# Patient Record
Sex: Female | Born: 1955 | Race: White | Hispanic: No | Marital: Married | Smoking: Never smoker
Health system: Southern US, Community
[De-identification: ages and names within clinical notes are randomized; demographics above are authoritative.]

## PROBLEM LIST (undated history)

## (undated) DIAGNOSIS — M1990 Unspecified osteoarthritis, unspecified site: Secondary | ICD-10-CM

## (undated) DIAGNOSIS — I10 Essential (primary) hypertension: Secondary | ICD-10-CM

## (undated) DIAGNOSIS — R011 Cardiac murmur, unspecified: Secondary | ICD-10-CM

## (undated) DIAGNOSIS — E785 Hyperlipidemia, unspecified: Secondary | ICD-10-CM

## (undated) DIAGNOSIS — E119 Type 2 diabetes mellitus without complications: Secondary | ICD-10-CM

## (undated) DIAGNOSIS — G43909 Migraine, unspecified, not intractable, without status migrainosus: Secondary | ICD-10-CM

## (undated) HISTORY — DX: Migraine, unspecified, not intractable, without status migrainosus: G43.909

## (undated) HISTORY — DX: Cardiac murmur, unspecified: R01.1

## (undated) HISTORY — DX: Unspecified osteoarthritis, unspecified site: M19.90

## (undated) HISTORY — PX: COLONOSCOPY: SHX174

## (undated) HISTORY — DX: Type 2 diabetes mellitus without complications: E11.9

## (undated) HISTORY — DX: Essential (primary) hypertension: I10

## (undated) HISTORY — DX: Hyperlipidemia, unspecified: E78.5

---

## 2003-02-21 HISTORY — PX: APPENDECTOMY: SHX54

## 2014-02-20 HISTORY — PX: MECHANICAL AORTIC VALVE REPLACEMENT: SHX2013

## 2017-12-06 ENCOUNTER — Ambulatory Visit (INDEPENDENT_AMBULATORY_CARE_PROVIDER_SITE_OTHER): Payer: BLUE CROSS/BLUE SHIELD | Admitting: Family Medicine

## 2017-12-06 ENCOUNTER — Encounter: Payer: Self-pay | Admitting: Family Medicine

## 2017-12-06 VITALS — BP 150/70 | HR 90 | Temp 98.5°F | Ht 64.0 in | Wt 249.2 lb

## 2017-12-06 DIAGNOSIS — Z952 Presence of prosthetic heart valve: Secondary | ICD-10-CM | POA: Insufficient documentation

## 2017-12-06 DIAGNOSIS — G43809 Other migraine, not intractable, without status migrainosus: Secondary | ICD-10-CM

## 2017-12-06 DIAGNOSIS — E785 Hyperlipidemia, unspecified: Secondary | ICD-10-CM

## 2017-12-06 DIAGNOSIS — E1169 Type 2 diabetes mellitus with other specified complication: Secondary | ICD-10-CM

## 2017-12-06 DIAGNOSIS — G43909 Migraine, unspecified, not intractable, without status migrainosus: Secondary | ICD-10-CM | POA: Insufficient documentation

## 2017-12-06 DIAGNOSIS — I152 Hypertension secondary to endocrine disorders: Secondary | ICD-10-CM

## 2017-12-06 DIAGNOSIS — Z1211 Encounter for screening for malignant neoplasm of colon: Secondary | ICD-10-CM

## 2017-12-06 DIAGNOSIS — E119 Type 2 diabetes mellitus without complications: Secondary | ICD-10-CM

## 2017-12-06 DIAGNOSIS — I10 Essential (primary) hypertension: Secondary | ICD-10-CM

## 2017-12-06 DIAGNOSIS — Z23 Encounter for immunization: Secondary | ICD-10-CM

## 2017-12-06 DIAGNOSIS — E1159 Type 2 diabetes mellitus with other circulatory complications: Secondary | ICD-10-CM

## 2017-12-06 MED ORDER — METOPROLOL SUCCINATE ER 25 MG PO TB24: 25.0000 mg | ORAL_TABLET | Freq: Every day | ORAL | 3 refills | Status: DC

## 2017-12-06 NOTE — Progress Notes (Signed)
Denise Curtis DOB: 08/13/1955 Encounter date: 12/06/2017  This is a 62 y.o. female who presents to establish care. Chief Complaint  Patient presents with  . New Patient (Initial Visit)    flu shot?    History of present illness: Just moved here from [place]. Had planned to retire here in a few years but decided to move a little early for husband's job opportunity.   DM: Dx in 2010. Sugars go up and down "just like my weight". Not in best shape, not been exercising since she moved. Eating out more. Has actually had every meal at home this week. Had diarrhea and nausea with metformin.   Mechanical heart valve 2.5-3.5 range. Monitoring at home and has app on phone where she taps in values and then coumadin clinic notified and they contact her. She needs to get set up with local monitoring. Has had a hard time keeping it in normal range in last year but has had some weight and stress fluctuation.   Past Medical History:  Diagnosis Date  . Diabetes mellitus type II, non insulin dependent ([place])   . Hyperlipidemia   . Hypertension   . Migraines    Past Surgical History:  Procedure Laterality Date  . APPENDECTOMY  2005  . MECHANICAL AORTIC VALVE REPLACEMENT  2016   Allergies  Allergen Reactions  . Metformin And Related Nausea And Vomiting  . Hydrochlorothiazide Anxiety  . Penicillins Rash   Current Meds  Medication Sig  . atorvastatin (LIPITOR) 40 MG tablet Take 40 mg by mouth daily.  . clindamycin (CLEOCIN) 300 MG capsule Take 300 mg by mouth 2 (two) times daily. Prior to dental procedure  . empagliflozin (JARDIANCE) 25 MG TABS tablet Take 25 mg by mouth daily.  Marland Kitchen lisinopril (PRINIVIL,ZESTRIL) 5 MG tablet Take 5 mg by mouth daily.  Marland Kitchen warfarin (COUMADIN) 5 MG tablet Take 5 mg by mouth daily. 1 1/2 tablet daily except thursday, take 2 tablets   Social History   Tobacco Use  . Smoking status: Never Smoker  . Smokeless tobacco: Never Used  Substance Use Topics  . Alcohol  use: Yes    Comment: occasional   Family History  Problem Relation Age of Onset  . COPD Mother   . Heart disease Mother   . Squamous cell carcinoma Mother 69  . COPD Father   . Stroke Paternal Grandfather      Review of Systems  Objective:  BP (!) 150/70 (BP Location: Left Arm, Patient Position: Sitting, Cuff Size: Large)   Pulse 90   Temp 98.5 F (36.9 C) (Oral)   Ht 5\' 4"  (1.626 m)   Wt 249 lb 3.2 oz (113 kg)   SpO2 95%   BMI 42.78 kg/m   Weight: 249 lb 3.2 oz (113 kg)   BP Readings from Last 3 Encounters:  12/06/17 (!) 150/70   Wt Readings from Last 3 Encounters:  12/06/17 249 lb 3.2 oz (113 kg)    Physical Exam  Constitutional: She is oriented to person, place, and time. She appears well-developed. No distress.  obese  Cardiovascular: Normal rate and regular rhythm. Exam reveals no friction rub.  Murmur heard.  Systolic murmur is present with a grade of 2/6. Trace lower extremity edema Crisp mechanical heart sound  Pulmonary/Chest: Effort normal and breath sounds normal. No respiratory distress. She has no wheezes. She has no rales.  Feet:  Right Foot:  Protective Sensation: 10 sites tested. 10 sites sensed.  Skin Integrity: Negative for ulcer,  blister, skin breakdown, erythema or callus.  Left Foot:  Protective Sensation: 10 sites tested. 10 sites sensed.  Skin Integrity: Negative for ulcer, blister, skin breakdown, erythema or callus.  Neurological: She is alert and oriented to person, place, and time.  Psychiatric: Her behavior is normal. Cognition and memory are normal.    Assessment/Plan: 1. Diabetes mellitus type II, non insulin dependent ([place]) Will repeat bloodwork in future; follow up pending results. Discussed importance of regular exercise and she is trying to eat more meals at home. She is motivated to get to healthier weight and stay in control of medical issues. - Hemoglobin A1c; Future - Microalbumin / creatinine urine ratio; Future - HM  DIABETES FOOT EXAM  2. Other migraine without status migrainosus, not intractable Improved since heart valve placement. Occasional headache, tolerable.  3. Mechanical heart valve present Follows with cardiology. On warfarin. Monitoring at home. We will look into process for staying with this remote testing since so convenient for her and she has had some difficulty with control. .  4. Screening for colon cancer Has put off colonoscopy due to task of weaning off coumadin and bridging back on. Willing to start with cologuard. - Cologuard  5. Hyperlipidemia associated with type 2 diabetes mellitus ([place])  - Lipid panel; Future  6. Hypertension associated with diabetes ([place]) Slightly elevated. Continue current medication for now and we will reassess at future visit after bloodwork.  - metoprolol succinate (TOPROL-XL) 25 MG 24 hr tablet; Take 1 tablet (25 mg total) by mouth daily.  Dispense: 90 tablet; Refill: 3 - CBC with Differential/Platelet; Future - Comprehensive metabolic panel; Future  7. Need for immunization against influenza  - Flu Vaccine QUAD 36+ mos IM  Return pending bloodwork.   Willing to get other immunizations at follow up ([place])  Micheline Rough, MD

## 2017-12-07 ENCOUNTER — Encounter: Payer: Self-pay | Admitting: Family Medicine

## 2018-01-02 ENCOUNTER — Other Ambulatory Visit: Payer: Self-pay | Admitting: Family Medicine

## 2018-01-02 NOTE — Telephone Encounter (Signed)
Copied from [place] [id number]. Topic: Quick Communication - See Telephone Encounter >> Jan 02, 2018  1:08 PM Ivar Drape wrote: CRM for notification. See Telephone encounter for: 01/02/18. Patient would like a refill on her warfarin (COUMADIN) 5 MG tablet medication and have it [facility] [place].  Patient is dangerously low on the medication and she will be leaving town tomorrow.

## 2018-01-02 NOTE — Telephone Encounter (Signed)
Requested medication (s) are due for refill today: Unsure  Requested medication (s) are on the active medication list: Yes  Last refill:  Unsure  Future visit scheduled: No  Notes to clinic:  Coumadin refill    Requested Prescriptions  Pending Prescriptions Disp Refills   warfarin (COUMADIN) 5 MG tablet      Sig: Take 1 tablet (5 mg total) by mouth daily. 1 1/2 tablet daily except thursday, take 2 tablets     Hematology:  Anticoagulants - warfarin Failed - 01/02/2018  1:13 PM      Failed - If the patient is managed by Coumadin Clinic - route to their Pool. If not, forward to the provider.      Failed - INR in normal range and within 30 days    No results found for: INR, LABINR, INRTHROMBS       Passed - Valid encounter within last 3 months    Recent Outpatient Visits          3 weeks ago Diabetes mellitus type II, non insulin dependent ([place])   [place] [facility], Steele Berg, MD

## 2018-01-03 ENCOUNTER — Other Ambulatory Visit: Payer: Self-pay | Admitting: General Practice

## 2018-01-03 MED ORDER — WARFARIN SODIUM 5 MG PO TABS: ORAL_TABLET | ORAL | 0 refills | Status: DC

## 2018-01-15 ENCOUNTER — Other Ambulatory Visit: Payer: Self-pay | Admitting: Family Medicine

## 2018-01-15 NOTE — Telephone Encounter (Signed)
Copied from [place] [id number]. Topic: Quick Communication - Rx Refill/Question >> Jan 15, 2018  2:52 PM Conception Chancy, NT wrote: Medication: lisinopril (PRINIVIL,ZESTRIL) 5 MG tablet and empagliflozin (JARDIANCE) 25 MG TABS tablet (90 day supply for both)  Has the patient contacted their pharmacy? Yes.  Was told to contact her pcp since these will be new prescriptions with her new provider  Preferred Pharmacy (with phone number or street name): [id number] [address], [place] - [address] [place] [address] Phone: [phone] Fax: [phone]    Agent: Please be advised that RX refills may take up to 3 business days. We ask that you follow-up with your pharmacy.

## 2018-01-15 NOTE — Telephone Encounter (Signed)
Historical med.  Ok to fill? 

## 2018-01-16 MED ORDER — LISINOPRIL 5 MG PO TABS: 5.0000 mg | ORAL_TABLET | Freq: Every day | ORAL | 1 refills | Status: DC

## 2018-01-16 NOTE — Telephone Encounter (Signed)
ok 

## 2018-01-16 NOTE — Telephone Encounter (Signed)
RX has been sent.

## 2018-01-21 ENCOUNTER — Telehealth: Payer: Self-pay | Admitting: Family Medicine

## 2018-01-21 NOTE — Telephone Encounter (Signed)
Pt states the  empagliflozin (JARDIANCE) 25 MG TABS tablet Was never called in.  Dr Ethlyn Gallery has never filled this for her. Needs 90 day   [id number] [address], [place] - [place] [phone] (Phone) [phone] (Fax)

## 2018-01-21 NOTE — Telephone Encounter (Signed)
Copied from [place] [id number]. Topic: Quick Communication - Rx Refill/Question >> Jan 21, 2018  3:29 PM Denise Curtis wrote: Medication: warfarin (COUMADIN) 5 MG tablet  Pt needs to be set up for her coum check, pt coming in wed afternoon for that to be checked

## 2018-01-22 MED ORDER — EMPAGLIFLOZIN 25 MG PO TABS: 25.0000 mg | ORAL_TABLET | Freq: Every day | ORAL | 2 refills | Status: DC

## 2018-01-22 NOTE — Telephone Encounter (Signed)
RX sent

## 2018-01-22 NOTE — Addendum Note (Signed)
Addended by: Rebecca Eaton on: 01/22/2018 11:09 AM   Modules accepted: Orders

## 2018-01-23 ENCOUNTER — Other Ambulatory Visit: Payer: Self-pay | Admitting: General Practice

## 2018-01-23 ENCOUNTER — Ambulatory Visit (INDEPENDENT_AMBULATORY_CARE_PROVIDER_SITE_OTHER): Payer: BLUE CROSS/BLUE SHIELD | Admitting: General Practice

## 2018-01-23 DIAGNOSIS — Z952 Presence of prosthetic heart valve: Secondary | ICD-10-CM

## 2018-01-23 DIAGNOSIS — Z7901 Long term (current) use of anticoagulants: Secondary | ICD-10-CM | POA: Insufficient documentation

## 2018-01-23 LAB — POCT INR: INR: 3.6 — AB (ref 2.0–3.0)

## 2018-01-23 MED ORDER — WARFARIN SODIUM 5 MG PO TABS: ORAL_TABLET | ORAL | 0 refills | Status: DC

## 2018-01-23 NOTE — Patient Instructions (Signed)
Pre visit review using our clinic review tool, if applicable. No additional management support is needed unless otherwise documented below in the visit note.  Hold dosage today (12/4) and then take 7.5 mg daily except 10 mg on Wednesdays.  Re-check in 1 week.  Patient will be using [employer] home monitoring service.

## 2018-01-24 ENCOUNTER — Telehealth: Payer: Self-pay

## 2018-01-24 NOTE — Telephone Encounter (Signed)
Copied from [place] [id number]. Topic: Appointment Scheduling - Transfer of Care >> Jan 24, 2018  4:14 PM Judyann Munson wrote: Pt is requesting to transfer FROM: Denise Curtis  Pt is requesting to transfer TO: [name]  Reason for requested transfer:  Husband is a patient of Dr.Kremer   Send CRM to patient's current PCP (transferring FROM).

## 2018-01-24 NOTE — Telephone Encounter (Signed)
ok 

## 2018-01-24 NOTE — Telephone Encounter (Signed)
Ok for transfer? 

## 2018-01-25 NOTE — Telephone Encounter (Signed)
See message below °

## 2018-01-25 NOTE — Telephone Encounter (Signed)
Pt would like to transfer to Dr. Ethelene Hal.

## 2018-01-28 NOTE — Telephone Encounter (Signed)
Okay with me 

## 2018-01-29 NOTE — Telephone Encounter (Signed)
I called and left message on patient voicemail that transfer has been approved to [facility] - Dr. Ethelene Hal. Patient can call and and schedule appointment.

## 2018-01-30 ENCOUNTER — Ambulatory Visit (INDEPENDENT_AMBULATORY_CARE_PROVIDER_SITE_OTHER): Payer: BLUE CROSS/BLUE SHIELD | Admitting: General Practice

## 2018-01-30 DIAGNOSIS — Z952 Presence of prosthetic heart valve: Secondary | ICD-10-CM

## 2018-01-30 DIAGNOSIS — Z7901 Long term (current) use of anticoagulants: Secondary | ICD-10-CM | POA: Diagnosis not present

## 2018-01-30 LAB — POCT INR: INR: 2.3 (ref 2.0–3.0)

## 2018-01-30 NOTE — Progress Notes (Signed)
I have reviewed and agree with note, evaluation, plan.   Kreg Earhart, MD  

## 2018-01-30 NOTE — Patient Instructions (Signed)
Pre visit review using our clinic review tool, if applicable. No additional management support is needed unless otherwise documented below in the visit note.  Take 2 tablets (10 mg) today and then continue to take  7.5 mg daily except 10 mg on Wednesdays.  Re-check in 1 week.  Patient will be using [employer] home monitoring service.

## 2018-02-11 ENCOUNTER — Ambulatory Visit (INDEPENDENT_AMBULATORY_CARE_PROVIDER_SITE_OTHER): Payer: BLUE CROSS/BLUE SHIELD | Admitting: General Practice

## 2018-02-11 DIAGNOSIS — Z7901 Long term (current) use of anticoagulants: Secondary | ICD-10-CM | POA: Diagnosis not present

## 2018-02-11 DIAGNOSIS — Z952 Presence of prosthetic heart valve: Secondary | ICD-10-CM

## 2018-02-11 LAB — POCT INR: INR: 2.5 (ref 2.0–3.0)

## 2018-02-11 NOTE — Patient Instructions (Signed)
Pre visit review using our clinic review tool, if applicable. No additional management support is needed unless otherwise documented below in the visit note.  Continue to take  7.5 mg daily except 10 mg on Wednesdays.  Re-check in 1 week.  Patient will be using [employer] home monitoring service.

## 2018-02-14 DIAGNOSIS — S92501A Displaced unspecified fracture of right lesser toe(s), initial encounter for closed fracture: Secondary | ICD-10-CM | POA: Diagnosis not present

## 2018-02-26 ENCOUNTER — Ambulatory Visit (INDEPENDENT_AMBULATORY_CARE_PROVIDER_SITE_OTHER): Payer: BLUE CROSS/BLUE SHIELD | Admitting: General Practice

## 2018-02-26 DIAGNOSIS — Z7901 Long term (current) use of anticoagulants: Secondary | ICD-10-CM | POA: Diagnosis not present

## 2018-02-26 DIAGNOSIS — Z952 Presence of prosthetic heart valve: Secondary | ICD-10-CM

## 2018-02-26 LAB — POCT INR: INR: 2.3 (ref 2.0–3.0)

## 2018-02-26 NOTE — Patient Instructions (Signed)
Pre visit review using our clinic review tool, if applicable. No additional management support is needed unless otherwise documented below in the visit note.  Continue to take  7.5 mg daily except 10 mg on Wednesdays.  Re-check in 1 week.  Patient will be using [employer] home monitoring service.

## 2018-02-28 DIAGNOSIS — H00015 Hordeolum externum left lower eyelid: Secondary | ICD-10-CM | POA: Diagnosis not present

## 2018-03-01 ENCOUNTER — Other Ambulatory Visit: Payer: Self-pay | Admitting: Family Medicine

## 2018-03-01 MED ORDER — ATORVASTATIN CALCIUM 40 MG PO TABS: 40.0000 mg | ORAL_TABLET | Freq: Every day | ORAL | 1 refills | Status: DC

## 2018-03-01 NOTE — Telephone Encounter (Signed)
Historical med.  Ok to fill? 

## 2018-03-01 NOTE — Telephone Encounter (Signed)
Requested medication (s) are due for refill today: yes  Requested medication (s) are on the active medication list: yes  Last refill:  Last filled by historical provider  Future visit scheduled: No  Notes to clinic:  Unable to refill per protocol. Last filled by historical provider    Requested Prescriptions  Pending Prescriptions Disp Refills   atorvastatin (LIPITOR) 40 MG tablet      Sig: Take 1 tablet (40 mg total) by mouth daily.     Cardiovascular:  Antilipid - Statins Failed - 03/01/2018  1:58 PM      Failed - Total Cholesterol in normal range and within 360 days    No results found for: CHOL, POCCHOL       Failed - LDL in normal range and within 360 days    No results found for: LDLCALC, LDLC, HIRISKLDL       Failed - HDL in normal range and within 360 days    No results found for: HDL       Failed - Triglycerides in normal range and within 360 days    No results found for: TRIG       Passed - Patient is not pregnant      Passed - Valid encounter within last 12 months    Recent Outpatient Visits          2 months ago Diabetes mellitus type II, non insulin dependent ([place])   [place] [facility], Steele Berg, MD

## 2018-03-01 NOTE — Telephone Encounter (Signed)
Copied from [place] [id number]. Topic: Quick Communication - Rx Refill/Question >> Mar 01, 2018  1:50 PM Windy Kalata wrote: Medication: atorvastatin (LIPITOR) 40 MG tablet , 90 day supply  Has the patient contacted their pharmacy? Yes.   (Agent: If no, request that the patient contact the pharmacy for the refill.) (Agent: If yes, when and what did the pharmacy advise?) No refills left call office  Preferred Pharmacy (with phone number or street name): [id number] [place], [place] [phone] (Phone) [phone] (Fax)    Agent: Please be advised that RX refills may take up to 3 business days. We ask that you follow-up with your pharmacy.

## 2018-03-04 ENCOUNTER — Encounter: Payer: Self-pay | Admitting: Family Medicine

## 2018-03-04 ENCOUNTER — Ambulatory Visit: Payer: BLUE CROSS/BLUE SHIELD | Admitting: Family Medicine

## 2018-03-04 VITALS — BP 132/80 | HR 82 | Ht 64.0 in | Wt 253.0 lb

## 2018-03-04 DIAGNOSIS — Z952 Presence of prosthetic heart valve: Secondary | ICD-10-CM | POA: Diagnosis not present

## 2018-03-04 DIAGNOSIS — Z1239 Encounter for other screening for malignant neoplasm of breast: Secondary | ICD-10-CM

## 2018-03-04 DIAGNOSIS — E119 Type 2 diabetes mellitus without complications: Secondary | ICD-10-CM

## 2018-03-04 DIAGNOSIS — Z7901 Long term (current) use of anticoagulants: Secondary | ICD-10-CM | POA: Diagnosis not present

## 2018-03-04 DIAGNOSIS — Z0000 Encounter for general adult medical examination without abnormal findings: Secondary | ICD-10-CM | POA: Insufficient documentation

## 2018-03-04 DIAGNOSIS — M65312 Trigger thumb, left thumb: Secondary | ICD-10-CM

## 2018-03-04 LAB — COMPREHENSIVE METABOLIC PANEL
ALT: 26 U/L (ref 0–35)
AST: 16 U/L (ref 0–37)
Albumin: 4 g/dL (ref 3.5–5.2)
Alkaline Phosphatase: 99 U/L (ref 39–117)
BUN: 19 mg/dL (ref 6–23)
CALCIUM: 9.1 mg/dL (ref 8.4–10.5)
CO2: 26 mEq/L (ref 19–32)
Chloride: 102 mEq/L (ref 96–112)
Creatinine, Ser: 0.59 mg/dL (ref 0.40–1.20)
GFR: 108.76 mL/min (ref >60.00)
Glucose, Bld: 208 mg/dL — ABNORMAL HIGH (ref 70–99)
Potassium: 4.5 mEq/L (ref 3.5–5.1)
Sodium: 137 mEq/L (ref 135–145)
Total Bilirubin: 0.6 mg/dL (ref 0.2–1.2)
Total Protein: 6.6 g/dL (ref 6.0–8.3)

## 2018-03-04 LAB — CBC
HEMATOCRIT: 44.8 % (ref 36.0–46.0)
HEMOGLOBIN: 14.8 g/dL (ref 12.0–15.0)
MCHC: 33 g/dL (ref 30.0–36.0)
MCV: 83.1 fl (ref 78.0–100.0)
PLATELETS: 173 10*3/uL (ref 150.0–400.0)
RBC: 5.39 Mil/uL — ABNORMAL HIGH (ref 3.87–5.11)
RDW: 16 % — ABNORMAL HIGH (ref 11.5–15.5)
WBC: 6.4 10*3/uL (ref 4.0–10.5)

## 2018-03-04 LAB — LIPID PANEL
CHOLESTEROL: 156 mg/dL (ref 0–200)
HDL: 39 mg/dL — ABNORMAL LOW (ref >39.00)
LDL Cholesterol: 81 mg/dL (ref 0–99)
NonHDL: 116.56
TRIGLYCERIDES: 177 mg/dL — AB (ref 0.0–149.0)
Total CHOL/HDL Ratio: 4
VLDL: 35.4 mg/dL (ref 0.0–40.0)

## 2018-03-04 LAB — POCT GLYCOSYLATED HEMOGLOBIN (HGB A1C): Hemoglobin A1C: 8.5 % — AB (ref 4.0–5.6)

## 2018-03-04 LAB — URINALYSIS, ROUTINE W REFLEX MICROSCOPIC
Bilirubin Urine: NEGATIVE
Hgb urine dipstick: NEGATIVE
Ketones, ur: NEGATIVE
Leukocytes, UA: NEGATIVE
Nitrite: NEGATIVE
RBC / HPF: NONE SEEN (ref 0–?)
Specific Gravity, Urine: 1.025 (ref 1.000–1.030)
Total Protein, Urine: NEGATIVE
Urine Glucose: >=1000 — AB
Urobilinogen, UA: 0.2 (ref 0.0–1.0)
WBC, UA: NONE SEEN (ref 0–?)
pH: 5 (ref 5.0–8.0)

## 2018-03-04 LAB — LDL CHOLESTEROL, DIRECT: Direct LDL: 95 mg/dL

## 2018-03-04 LAB — MICROALBUMIN / CREATININE URINE RATIO
Creatinine,U: 91.2 mg/dL
Microalb Creat Ratio: 1.4 mg/g (ref 0.0–30.0)
Microalb, Ur: 1.3 mg/dL (ref 0.0–1.9)

## 2018-03-04 MED ORDER — SITAGLIPTIN PHOSPHATE 50 MG PO TABS: 50.0000 mg | ORAL_TABLET | Freq: Every day | ORAL | 2 refills | Status: DC

## 2018-03-04 NOTE — Progress Notes (Signed)
Established Patient Office Visit  Subjective:  Patient ID: Denise Curtis, female    DOB: [birth year]  Age: 63 y.o. MRN: [id number]  CC:  Chief Complaint  Patient presents with  . Establish Care    HPI [name] presents for establishment of care by way of transfer.  Significant past medical history of diabetes type 2, mechanical heart valve replacement.  Hemoglobin A1c today is 8.5.  She is taking Jardiance 25 mg daily only.  She has not tolerated metformin in the past.  It had caused diarrhea.  She has recently joined [employer] with her husband and they are planning on losing weight together.  She has a mechanical heart valve and I will be monitoring her INR.  Suggested INR for her is 2.5-3.5.  She will be checking her INRs at home.  She lives with her husband.  They moved into the area to be closer to her mother who is currently undergoing radiation treatment for cancer.  Patient's left thumb becomes stuck in a flexed position at times.  She broke her right little toe 3 weeks ago.  Past Medical History:  Diagnosis Date  . Diabetes mellitus type II, non insulin dependent ([place])   . Hyperlipidemia   . Hypertension   . Migraines     Past Surgical History:  Procedure Laterality Date  . APPENDECTOMY  2005  . MECHANICAL AORTIC VALVE REPLACEMENT  2016    Family History  Problem Relation Age of Onset  . COPD Mother   . Heart disease Mother   . Squamous cell carcinoma Mother 72  . COPD Father   . Stroke Paternal Grandfather     Social History   Socioeconomic History  . Marital status: Married    Spouse name: Not on file  . Number of children: Not on file  . Years of education: Not on file  . Highest education level: Not on file  Occupational History  . Not on file  Social Needs  . Financial resource strain: Not on file  . Food insecurity:    Worry: Not on file    Inability: Not on file  . Transportation needs:    Medical: Not on file   Non-medical: Not on file  Tobacco Use  . Smoking status: Never Smoker  . Smokeless tobacco: Never Used  Substance and Sexual Activity  . Alcohol use: Yes    Comment: occasional  . Drug use: Never  . Sexual activity: Not Currently    Partners: Male  Lifestyle  . Physical activity:    Days per week: Not on file    Minutes per session: Not on file  . Stress: Not on file  Relationships  . Social connections:    Talks on phone: Not on file    Gets together: Not on file    Attends religious service: Not on file    Active member of club or organization: Not on file    Attends meetings of clubs or organizations: Not on file    Relationship status: Not on file  . Intimate partner violence:    Fear of current or ex partner: Not on file    Emotionally abused: Not on file    Physically abused: Not on file    Forced sexual activity: Not on file  Other Topics Concern  . Not on file  Social History Narrative  . Not on file    Outpatient Medications Prior to Visit  Medication Sig Dispense Refill  .  atorvastatin (LIPITOR) 40 MG tablet Take 1 tablet (40 mg total) by mouth daily. 90 tablet 1  . clindamycin (CLEOCIN) 300 MG capsule Take 300 mg by mouth 2 (two) times daily. Prior to dental procedure    . empagliflozin (JARDIANCE) 25 MG TABS tablet Take 25 mg by mouth daily. 90 tablet 2  . lisinopril (PRINIVIL,ZESTRIL) 5 MG tablet Take 1 tablet (5 mg total) by mouth daily. 90 tablet 1  . metoprolol succinate (TOPROL-XL) 25 MG 24 hr tablet Take 1 tablet (25 mg total) by mouth daily. 90 tablet 3  . warfarin (COUMADIN) 5 MG tablet 1 1/2 tablet daily except thursday, take 2 tablets or TAKE AS DIRECTED.  60 day supply 100 tablet 0  . empagliflozin (JARDIANCE) 25 MG TABS tablet Take 25 mg by mouth daily.     No facility-administered medications prior to visit.     Allergies  Allergen Reactions  . Metformin And Related Nausea And Vomiting  . Hydrochlorothiazide Anxiety  . Penicillins Rash     ROS Review of Systems  Constitutional: Negative for diaphoresis, fatigue, fever and unexpected weight change.  HENT: Negative.   Eyes: Negative for photophobia and visual disturbance.  Respiratory: Negative.   Cardiovascular: Negative.   Gastrointestinal: Negative.   Endocrine: Negative for polyphagia and polyuria.  Genitourinary: Negative.   Musculoskeletal: Negative.   Skin: Negative for pallor and rash.  Allergic/Immunologic: Negative for immunocompromised state.  Neurological: Negative for weakness, light-headedness and headaches.  Hematological: Does not bruise/bleed easily.  Psychiatric/Behavioral: Negative.       Objective:    Physical Exam  BP 132/80   Pulse 82   Ht 5' 4"  (1.626 m)   Wt 253 lb (114.8 kg)   SpO2 96%   BMI 43.43 kg/m  Wt Readings from Last 3 Encounters:  03/04/18 253 lb (114.8 kg)  12/06/17 249 lb 3.2 oz (113 kg)   BP Readings from Last 3 Encounters:  03/04/18 132/80  12/06/17 (!) 150/70   Guideline developer:  UpToDate (see UpToDate for funding source) Date Released: June 2014  Health Maintenance Due  Topic Date Due  . HEMOGLOBIN A1C  [birth year]  . Hepatitis C Screening  Jul 07, 1955  . OPHTHALMOLOGY EXAM  04/13/1963  . HIV Screening  04/12/1968  . TETANUS/TDAP  04/12/1972  . PAP SMEAR-Modifier  04/12/1974  . MAMMOGRAM  04/13/2003  . COLONOSCOPY  04/13/2003    There are no preventive care reminders to display for this patient.  No results found for: TSH No results found for: WBC, HGB, HCT, MCV, PLT No results found for: NA, K, CHLORIDE, CO2, GLUCOSE, BUN, CREATININE, BILITOT, ALKPHOS, AST, ALT, PROT, ALBUMIN, CALCIUM, ANIONGAP, EGFR, GFR No results found for: CHOL No results found for: HDL No results found for: LDLCALC No results found for: TRIG No results found for: CHOLHDL Lab Results  Component Value Date   HGBA1C 8.5 (A) 03/04/2018      Assessment & Plan:   Problem List Items Addressed This Visit      Endocrine    Diabetes mellitus type II, non insulin dependent ([place]) - Primary   Relevant Medications   sitaGLIPtin (JANUVIA) 50 MG tablet   Other Relevant Orders   POC HgB A1c (Completed)   CBC   Comprehensive metabolic panel   LDL cholesterol, direct   Lipid panel   Urinalysis, Routine w reflex microscopic   Microalbumin / creatinine urine ratio     Musculoskeletal and Integument   Trigger finger of left thumb  Other   Mechanical heart valve present   Relevant Orders   CBC   Ambulatory referral to Cardiology   Chronic anticoagulation   Screening for breast cancer   Relevant Orders   MM Digital Screening      Meds ordered this encounter  Medications  . sitaGLIPtin (JANUVIA) 50 MG tablet    Sig: Take 1 tablet (50 mg total) by mouth daily.    Dispense:  30 tablet    Refill:  2    Follow-up: Return in about 3 months (around 06/03/2018).   Patient's hemoglobin A1c was 8.5.  Have added Januvia 50 mg to be taken with her Jardiance.  Encouraged her to continue with weight watchers and lose weight.  She was given information on exercising to lose weight.  She will use Aspercreme for her trigger finger.  May consider sports medicine referral for injection.  Referred for mammogram.  Referred for cardiology follow-up of her mechanical valve.  Follow-up in 3 months.

## 2018-03-04 NOTE — Patient Instructions (Signed)
Exercising to Lose Weight Exercise is structured, repetitive physical activity to improve fitness and health. Getting regular exercise is important for everyone. It is especially important if you are overweight. Being overweight increases your risk of heart disease, stroke, diabetes, high blood pressure, and several types of cancer. Reducing your calorie intake and exercising can help you lose weight. Exercise is usually categorized as moderate or vigorous intensity. To lose weight, most people need to do a certain amount of moderate-intensity or vigorous-intensity exercise each week. Moderate-intensity exercise  Moderate-intensity exercise is any activity that gets you moving enough to burn at least three times more energy (calories) than if you were sitting. Examples of moderate exercise include:  Walking a mile in 15 minutes.  Doing light yard work.  Biking at an easy pace. Most people should get at least 150 minutes (2 hours and 30 minutes) a week of moderate-intensity exercise to maintain their body weight. Vigorous-intensity exercise Vigorous-intensity exercise is any activity that gets you moving enough to burn at least six times more calories than if you were sitting. When you exercise at this intensity, you should be working hard enough that you are not able to carry on a conversation. Examples of vigorous exercise include:  Running.  Playing a team sport, such as football, basketball, and soccer.  Jumping rope. Most people should get at least 75 minutes (1 hour and 15 minutes) a week of vigorous-intensity exercise to maintain their body weight. How can exercise affect me? When you exercise enough to burn more calories than you eat, you lose weight. Exercise also reduces body fat and builds muscle. The more muscle you have, the more calories you burn. Exercise also:  Improves mood.  Reduces stress and tension.  Improves your overall fitness, flexibility, and  endurance.  Increases bone strength. The amount of exercise you need to lose weight depends on:  Your age.  The type of exercise.  Any health conditions you have.  Your overall physical ability. Talk to your health care provider about how much exercise you need and what types of activities are safe for you. What actions can I take to lose weight? Nutrition   Make changes to your diet as told by your health care provider or diet and nutrition specialist (dietitian). This may include: ? Eating fewer calories. ? Eating more protein. ? Eating less unhealthy fats. ? Eating a diet that includes fresh fruits and vegetables, whole grains, low-fat dairy products, and lean protein. ? Avoiding foods with added fat, salt, and sugar.  Drink plenty of water while you exercise to prevent dehydration or heat stroke. Activity  Choose an activity that you enjoy and set realistic goals. Your health care provider can help you make an exercise plan that works for you.  Exercise at a moderate or vigorous intensity most days of the week. ? The intensity of exercise may vary from person to person. You can tell how intense a workout is for you by paying attention to your breathing and heartbeat. Most people will notice their breathing and heartbeat get faster with more intense exercise.  Do resistance training twice each week, such as: ? Push-ups. ? Sit-ups. ? Lifting weights. ? Using resistance bands.  Getting short amounts of exercise can be just as helpful as long structured periods of exercise. If you have trouble finding time to exercise, try to include exercise in your daily routine. ? Get up, stretch, and walk around every 30 minutes throughout the day. ? Go for a   walk during your lunch break. ? Park your car farther away from your destination. ? If you take public transportation, get off one stop early and walk the rest of the way. ? Make phone calls while standing up and walking  around. ? Take the stairs instead of elevators or escalators.  Wear comfortable clothes and shoes with good support.  Do not exercise so much that you hurt yourself, feel dizzy, or get very short of breath. Where to find more information  U.S. Department of Health and Human Services: [employer].at  Centers for Disease Control and Prevention (CDC): http://www.wolf.info/ Contact a health care provider:  Before starting a new exercise program.  If you have questions or concerns about your weight.  If you have a medical problem that keeps you from exercising. Get help right away if you have any of the following while exercising:  Injury.  Dizziness.  Difficulty breathing or shortness of breath that does not go away when you stop exercising.  Chest pain.  Rapid heartbeat. Summary  Being overweight increases your risk of heart disease, stroke, diabetes, high blood pressure, and several types of cancer.  Losing weight happens when you burn more calories than you eat.  Reducing the amount of calories you eat in addition to getting regular moderate or vigorous exercise each week helps you lose weight. This information is not intended to replace advice given to you by your health care provider. Make sure you discuss any questions you have with your health care provider. Document Released: 03/11/2010 Document Revised: 02/19/2017 Document Reviewed: 02/19/2017 Elsevier Interactive Patient Education  2019 [employer].  Exercising to [employer] Exercise is structured, repetitive physical activity to improve fitness and health. Getting regular exercise is important for everyone. It is especially important if you are overweight. Being overweight increases your risk of heart disease, stroke, diabetes, high blood pressure, and several types of cancer. Reducing your calorie intake and exercising can help you lose weight. Exercise is usually categorized as moderate or vigorous intensity. To lose weight,  most people need to do a certain amount of moderate-intensity or vigorous-intensity exercise each week. Moderate-intensity exercise  Moderate-intensity exercise is any activity that gets you moving enough to burn at least three times more energy (calories) than if you were sitting. Examples of moderate exercise include:  Walking a mile in 15 minutes.  Doing light yard work.  Biking at an easy pace. Most people should get at least 150 minutes (2 hours and 30 minutes) a week of moderate-intensity exercise to maintain their body weight. Vigorous-intensity exercise Vigorous-intensity exercise is any activity that gets you moving enough to burn at least six times more calories than if you were sitting. When you exercise at this intensity, you should be working hard enough that you are not able to carry on a conversation. Examples of vigorous exercise include:  Running.  Playing a team sport, such as football, basketball, and soccer.  Jumping rope. Most people should get at least 75 minutes (1 hour and 15 minutes) a week of vigorous-intensity exercise to maintain their body weight. How can exercise affect me? When you exercise enough to burn more calories than you eat, you lose weight. Exercise also reduces body fat and builds muscle. The more muscle you have, the more calories you burn. Exercise also:  Improves mood.  Reduces stress and tension.  Improves your overall fitness, flexibility, and endurance.  Increases bone strength. The amount of exercise you need to lose weight depends on:  Your  age.  The type of exercise.  Any health conditions you have.  Your overall physical ability. Talk to your health care provider about how much exercise you need and what types of activities are safe for you. What actions can I take to lose weight? Nutrition   Make changes to your diet as told by your health care provider or diet and nutrition specialist (dietitian). This may  include: ? Eating fewer calories. ? Eating more protein. ? Eating less unhealthy fats. ? Eating a diet that includes fresh fruits and vegetables, whole grains, low-fat dairy products, and lean protein. ? Avoiding foods with added fat, salt, and sugar.  Drink plenty of water while you exercise to prevent dehydration or heat stroke. Activity  Choose an activity that you enjoy and set realistic goals. Your health care provider can help you make an exercise plan that works for you.  Exercise at a moderate or vigorous intensity most days of the week. ? The intensity of exercise may vary from person to person. You can tell how intense a workout is for you by paying attention to your breathing and heartbeat. Most people will notice their breathing and heartbeat get faster with more intense exercise.  Do resistance training twice each week, such as: ? Push-ups. ? Sit-ups. ? Lifting weights. ? Using resistance bands.  Getting short amounts of exercise can be just as helpful as long structured periods of exercise. If you have trouble finding time to exercise, try to include exercise in your daily routine. ? Get up, stretch, and walk around every 30 minutes throughout the day. ? Go for a walk during your lunch break. ? Park your car farther away from your destination. ? If you take public transportation, get off one stop early and walk the rest of the way. ? Make phone calls while standing up and walking around. ? Take the stairs instead of elevators or escalators.  Wear comfortable clothes and shoes with good support.  Do not exercise so much that you hurt yourself, feel dizzy, or get very short of breath. Where to find more information  U.S. Department of Health and Human Services: [employer]at  Centers for Disease Control and Prevention (CDC): http://www.wolf.info/ Contact a health care provider:  Before starting a new exercise program.  If you have questions or concerns about your  weight.  If you have a medical problem that keeps you from exercising. Get help right away if you have any of the following while exercising:  Injury.  Dizziness.  Difficulty breathing or shortness of breath that does not go away when you stop exercising.  Chest pain.  Rapid heartbeat. Summary  Being overweight increases your risk of heart disease, stroke, diabetes, high blood pressure, and several types of cancer.  Losing weight happens when you burn more calories than you eat.  Reducing the amount of calories you eat in addition to getting regular moderate or vigorous exercise each week helps you lose weight. This information is not intended to replace advice given to you by your health care provider. Make sure you discuss any questions you have with your health care provider. Document Released: 03/11/2010 Document Revised: 02/19/2017 Document Reviewed: 02/19/2017 Elsevier Interactive Patient Education  2019 [place]. Sitagliptin oral tablet What is this medicine? SITAGLIPTIN (sit a GLIP tin) helps to treat type 2 diabetes. It helps to control blood sugar. Treatment is combined with diet and exercise. This medicine may be used for other purposes; ask your health care provider or pharmacist  if you have questions. COMMON BRAND NAME(S): Januvia What should I tell my health care provider before I take this medicine? They need to know if you have any of these conditions: -diabetic ketoacidosis -kidney disease -pancreatitis -previous swelling of the tongue, face, or lips with difficulty breathing, difficulty swallowing, hoarseness, or tightening of the throat -type 1 diabetes -an unusual or allergic reaction to sitagliptin, other medicines, foods, dyes, or preservatives -pregnant or trying to get pregnant -breast-feeding How should I use this medicine? Take this medicine by mouth with a glass of water. Follow the directions on the prescription label. You can take it with or  without food. Do not cut, crush or chew this medicine. Take your dose at the same time each day. Do not take more often than directed. Do not stop taking except on your doctor's advice. A special MedGuide will be given to you by the pharmacist with each prescription and refill. Be sure to read this information carefully each time. Talk to your pediatrician regarding the use of this medicine in children. Special care may be needed. Overdosage: If you think you have taken too much of this medicine contact a poison control center or emergency room at once. NOTE: This medicine is only for you. Do not share this medicine with others. What if I miss a dose? If you miss a dose, take it as soon as you can. If it is almost time for your next dose, take only that dose. Do not take double or extra doses. What may interact with this medicine? Do not take this medicine with any of the following medications: -gatifloxacin This medicine may also interact with the following medications: -alcohol -digoxin -insulin -sulfonylureas like glimepiride, glipizide, glyburide This list may not describe all possible interactions. Give your health care provider a list of all the medicines, herbs, non-prescription drugs, or dietary supplements you use. Also tell them if you smoke, drink alcohol, or use illegal drugs. Some items may interact with your medicine. What should I watch for while using this medicine? Visit your doctor or health care professional for regular checks on your progress. A test called the HbA1C (A1C) will be monitored. This is a simple blood test. It measures your blood sugar control over the last 2 to 3 months. You will receive this test every 3 to 6 months. Learn how to check your blood sugar. Learn the symptoms of low and high blood sugar and how to manage them. Always carry a quick-source of sugar with you in case you have symptoms of low blood sugar. Examples include hard sugar candy or glucose  tablets. Make sure others know that you can choke if you eat or drink when you develop serious symptoms of low blood sugar, such as seizures or unconsciousness. They must get medical help at once. Tell your doctor or health care professional if you have high blood sugar. You might need to change the dose of your medicine. If you are sick or exercising more than usual, you might need to change the dose of your medicine. Do not skip meals. Ask your doctor or health care professional if you should avoid alcohol. Many nonprescription cough and cold products contain sugar or alcohol. These can affect blood sugar. Wear a medical ID bracelet or chain, and carry a card that describes your disease and details of your medicine and dosage times. What side effects may I notice from receiving this medicine? Side effects that you should report to your doctor or health care  professional as soon as possible: -allergic reactions like skin rash, itching or hives, swelling of the face, lips, or tongue -breathing problems -general ill feeling or flu-like symptoms -joint pain -loss of appetite -redness, blistering, peeling or loosening of the skin, including inside the mouth -signs and symptoms of heart failure like breathing problems, fast, irregular heartbeat, sudden weight gain; swelling of the ankles, feet, hands; unusually weak or tired -signs and symptoms of low blood sugar such as feeling anxious, confusion, dizziness, increased hunger, unusually weak or tired, sweating, shakiness, cold, irritable, headache, blurred vision, fast heartbeat, loss of consciousness -signs and symptoms of muscle injury like dark urine; trouble passing urine or change in the amount of urine; unusually weak or tired; muscle pain; back pain -unusual stomach upset or pain -vomiting Side effects that usually do not require medical attention (report to your doctor or health care professional if they continue or are  bothersome): -diarrhea -headache -sore throat -stomach upset -stuffy or runny nose This list may not describe all possible side effects. Call your doctor for medical advice about side effects. You may report side effects to FDA at 1-800-FDA-1088. Where should I keep my medicine? Keep out of the reach of children. Store at room temperature between 15 and 30 degrees C (59 and 86 degrees F). Throw away any unused medicine after the expiration date. NOTE: This sheet is a summary. It may not cover all possible information. If you have questions about this medicine, talk to your doctor, pharmacist, or health care provider.  2019 Elsevier/Gold Standard (2017-09-11 16:38:43)

## 2018-03-07 ENCOUNTER — Telehealth: Payer: Self-pay | Admitting: Family Medicine

## 2018-03-07 NOTE — Telephone Encounter (Signed)
Form filled out & placed on provider's desk for signature.

## 2018-03-07 NOTE — Telephone Encounter (Signed)
Copied from [place] [id number]. Topic: General - Inquiry >> Mar 07, 2018 10:47 AM Margot Ables wrote: Reason for CRM: pt states that she is going to be testing at home for INR and it is monitored by [employer]. Pt called and provided [employer] Dr. Avel Peace info as he is aware. [employer] states that to speed things up for pt care it would be best for Dr. Ethelene Hal or coumadin nurse to call to make sure info is release to Dr. Ethelene Hal from test. Please call Ph # [phone], option 4, give pts name and DOB, and they will verify MD information and send prescription/approval for readings to Dr. Ethelene Hal.

## 2018-03-07 NOTE — Telephone Encounter (Signed)
I called and spoke with [employer]. They will fax over a new physician form to be filled out. Awaiting form's arrival now.

## 2018-03-08 NOTE — Telephone Encounter (Signed)
Form faxed

## 2018-03-12 ENCOUNTER — Encounter (HOSPITAL_BASED_OUTPATIENT_CLINIC_OR_DEPARTMENT_OTHER): Payer: Self-pay

## 2018-03-12 ENCOUNTER — Ambulatory Visit (HOSPITAL_BASED_OUTPATIENT_CLINIC_OR_DEPARTMENT_OTHER)
Admission: RE | Admit: 2018-03-12 | Discharge: 2018-03-12 | Disposition: A | Discharge status: Home / Self Care | Payer: BLUE CROSS/BLUE SHIELD | Source: Ambulatory Visit | Attending: Family Medicine | Admitting: Family Medicine

## 2018-03-12 DIAGNOSIS — Z1239 Encounter for other screening for malignant neoplasm of breast: Secondary | ICD-10-CM

## 2018-03-12 DIAGNOSIS — Z1231 Encounter for screening mammogram for malignant neoplasm of breast: Secondary | ICD-10-CM | POA: Diagnosis not present

## 2018-03-12 NOTE — Telephone Encounter (Signed)
Pt called to give Dr. Ethelene Hal INR results from this morning. 3.4 (range is 2.5-3.5) Please advise.

## 2018-03-12 NOTE — Telephone Encounter (Signed)
She can continue her current regimen. What is her current dose regimen.

## 2018-03-14 NOTE — Telephone Encounter (Signed)
RN spoke with Dr. Ethelene Hal for clarification regarding the below note. Per Dr. Ethelene Hal, he will keep patient's regimen the same as previously, take Coumadin 7.5 mg daily, except 10 mg on Thursdays & recheck INR in 2 weeks.

## 2018-03-14 NOTE — Telephone Encounter (Signed)
Please decrease coumadin 7.5mg  daily. Recheck INR in 2 weeks.

## 2018-03-14 NOTE — Telephone Encounter (Signed)
Patient was made aware of the provider's recommendations below; she verbalized understanding and informed RN that she would be rechecking her INR on 03/19/18. Per the patient, her current regimen is 7.5 mg daily, except on Thursdays she takes 10 mg of Warfarin. In addition to this information, she voiced that in order to continue utilizing [employer] home monitoring services at the minimum she has to have entered in INR results twice in 1 month on her app; also once a quarter she will have a lab drawn for PT/INR and then 2 hours later recheck her INR using her home machine to compare results & assure that the machine is properly working.  RN reached out to [employer] to verify current status of the prescription form submitted for Dr. Ethelene Hal to have access to the patient's INR results each month. Per Elissa Hefty (representative w/ [employer]), all required information has been received and their system is now showing Dr. Ethelene Hal as the patient's physician, however the patient's app is still correlated with the old provider and will need to be updated. Elida informed RN that she would speak with her manager to get this switched over to the correct provider, so that the next time the patient checks her INR the results will automatically be faxed over to our office. Elida instructed RN to contact the patient and have her call in last INR result on 03/12/18 to [employer], so in return they will be able to fax Dr. Ethelene Hal the last result.  Message routed to PCP for review.

## 2018-03-20 ENCOUNTER — Other Ambulatory Visit: Payer: Self-pay | Admitting: Family Medicine

## 2018-03-20 NOTE — Telephone Encounter (Signed)
Copied from [place] [id number]. Topic: Quick Communication - Rx Refill/Question >> Mar 20, 2018  1:22 PM Scherrie Gerlach wrote: Medication: warfarin (COUMADIN) 5 MG tablet   100 tabs  [id number] [address], [place] - [place] [phone] (Phone) [phone] (Fax)   Pt states her coum is managed at home and results sent to the dr.

## 2018-03-20 NOTE — Telephone Encounter (Signed)
Requested medication (s) are due for refill today -yes  Requested medication (s) are on the active medication list -yes  Future visit scheduled -no  Last refill: 01/23/18  Notes to clinic: patient is requesting refill of medication that is monitored by lab and PCP- sent for review of refill request  Requested Prescriptions  Pending Prescriptions Disp Refills   warfarin (COUMADIN) 5 MG tablet 100 tablet 0    Sig: 1 1/2 tablet daily except thursday, take 2 tablets or TAKE AS DIRECTED.  60 day supply     Hematology:  Anticoagulants - warfarin Failed - 03/20/2018  1:33 PM      Failed - If the patient is managed by Coumadin Clinic - route to their Pool. If not, forward to the provider.      Passed - INR in normal range and within 30 days    INR  Date Value Ref Range Status  02/26/2018 2.3 2.0 - 3.0 Final    Comment:    INR reported by [employer] home monitoring         Passed - Valid encounter within last 3 months    Recent Outpatient Visits          2 weeks ago Diabetes mellitus type II, non insulin dependent ([place])   [facility], Mortimer Fries, MD   3 months ago Diabetes mellitus type II, non insulin dependent ([place])   [facility], Steele Berg, MD              Requested Prescriptions  Pending Prescriptions Disp Refills   warfarin (COUMADIN) 5 MG tablet 100 tablet 0    Sig: 1 1/2 tablet daily except thursday, take 2 tablets or TAKE AS DIRECTED.  60 day supply     Hematology:  Anticoagulants - warfarin Failed - 03/20/2018  1:33 PM      Failed - If the patient is managed by Coumadin Clinic - route to their Pool. If not, forward to the provider.      Passed - INR in normal range and within 30 days    INR  Date Value Ref Range Status  02/26/2018 2.3 2.0 - 3.0 Final    Comment:    INR reported by [employer] home monitoring         Passed - Valid encounter within last 3 months    Recent Outpatient Visits          2 weeks  ago Diabetes mellitus type II, non insulin dependent ([place])   [facility] Ethelene Hal, Mortimer Fries, MD   3 months ago Diabetes mellitus type II, non insulin dependent ([place])   [place], Steele Berg, MD

## 2018-03-21 MED ORDER — WARFARIN SODIUM 5 MG PO TABS: ORAL_TABLET | ORAL | 0 refills | Status: DC

## 2018-03-26 ENCOUNTER — Telehealth: Payer: Self-pay

## 2018-03-26 NOTE — Telephone Encounter (Signed)
INR Reading: 2.2 INR Range: 2.5-3.5 Patient's current dosage: take Coumadin 7.5 mg daily, except 10 mg on Thursdays

## 2018-03-26 NOTE — Telephone Encounter (Signed)
Increase Coumadin to 10mg  on Monday and Thursdays and 7.5 mg on other days. Repeat INR in 2 weeks.

## 2018-03-26 NOTE — Telephone Encounter (Signed)
Informed patient of the provider's recommendations below. She verbalized understanding and did not have any further questions or concerns prior to call ending. Next INR check will be 04/09/2018.

## 2018-04-02 ENCOUNTER — Ambulatory Visit: Payer: Self-pay | Admitting: General Practice

## 2018-04-08 ENCOUNTER — Encounter: Payer: Self-pay | Admitting: Interventional Cardiology

## 2018-04-09 ENCOUNTER — Telehealth: Payer: Self-pay

## 2018-04-09 LAB — POCT INR: INR: 2.8 (ref 2.0–3.0)

## 2018-04-09 NOTE — Telephone Encounter (Signed)
INR Reading: 2.8 INR Range: 2.5-3.5 Current Regimen: Coumadin 10mg  on Monday and Thursdays and 7.5 mg on other days.

## 2018-04-09 NOTE — Telephone Encounter (Signed)
Continue current dose and fu in one month.

## 2018-04-10 NOTE — Telephone Encounter (Signed)
I left patient a detailed voicemail per her dpr with the information below. I advised her to call back with any questions.

## 2018-04-15 ENCOUNTER — Ambulatory Visit (INDEPENDENT_AMBULATORY_CARE_PROVIDER_SITE_OTHER): Payer: Self-pay | Admitting: Behavioral Health

## 2018-04-15 ENCOUNTER — Ambulatory Visit: Payer: Self-pay | Admitting: General Practice

## 2018-04-15 DIAGNOSIS — Z7901 Long term (current) use of anticoagulants: Secondary | ICD-10-CM

## 2018-04-15 NOTE — Progress Notes (Signed)
INR reported by [facility]. °

## 2018-04-15 NOTE — Patient Instructions (Addendum)
Per Dr. Ethelene Hal: Continue taking Coumadin 7.5 mg daily, except 10 mg on Mondays & Thursdays. Recheck INR in 1 month.

## 2018-04-22 ENCOUNTER — Ambulatory Visit (INDEPENDENT_AMBULATORY_CARE_PROVIDER_SITE_OTHER): Payer: Medicare Other | Admitting: Family Medicine

## 2018-04-22 ENCOUNTER — Encounter: Payer: Self-pay | Admitting: Family Medicine

## 2018-04-22 VITALS — BP 140/88 | HR 78 | Temp 97.8°F | Ht 64.0 in | Wt 259.2 lb

## 2018-04-22 DIAGNOSIS — R05 Cough: Secondary | ICD-10-CM

## 2018-04-22 DIAGNOSIS — R059 Cough, unspecified: Secondary | ICD-10-CM | POA: Insufficient documentation

## 2018-04-22 MED ORDER — PREDNISONE 50 MG PO TABS: 50.0000 mg | ORAL_TABLET | Freq: Every day | ORAL | 0 refills | Status: DC

## 2018-04-22 MED ORDER — ALBUTEROL SULFATE HFA 108 (90 BASE) MCG/ACT IN AERS: 1.0000 | INHALATION_SPRAY | Freq: Four times a day (QID) | RESPIRATORY_TRACT | 0 refills | Status: DC | PRN

## 2018-04-22 NOTE — Patient Instructions (Signed)
Nice to meet you  Please try the prednisone  Please use the inhaler if you wake up coughing  Please try things such as zyrtec-D or allegra-D which is an antihistamine and decongestant.  Please try honey, vick's vapor rub, lozenges and humidifer for cough and sore throat   Please see [place] back if your symptoms fail to improve.

## 2018-04-22 NOTE — Progress Notes (Signed)
Denise Curtis - 63 y.o. female MRN [id number]  Date of birth: Aug 07, 1955  SUBJECTIVE:  Including CC & ROS.  Chief Complaint  Patient presents with  . Cough    cough/ chest congestion/ dx with acute bronchitis and sinusitis 2/26/ hasn't been sleeping/ crackling in chest/taking doxycycline    Denise Curtis is a 63 y.o. female that is presenting with cough and congestion.  She was seen in the clinic on 2/27 prescribed guaifenesin and codeine with doxycycline.  Reports her cough is been ongoing with no improvement.  It is worse at night.  No leg swelling.  The cough is worse when she lies down.  She is not been able to get any sleep.   Review of Systems  Constitutional: Negative for fever.  HENT: Positive for congestion.   Respiratory: Positive for cough.   Cardiovascular: Negative for chest pain.  Gastrointestinal: Negative for abdominal pain.  Musculoskeletal: Negative for back pain.    HISTORY: Past Medical, Surgical, Social, and Family History Reviewed & Updated per EMR.   Pertinent Historical Findings include:  Past Medical History:  Diagnosis Date  . Diabetes mellitus type II, non insulin dependent ([place])   . Hyperlipidemia   . Hypertension   . Migraines     Past Surgical History:  Procedure Laterality Date  . APPENDECTOMY  2005  . MECHANICAL AORTIC VALVE REPLACEMENT  2016    Allergies  Allergen Reactions  . Metformin And Related Nausea And Vomiting  . Hydrochlorothiazide Anxiety  . Penicillins Rash    Family History  Problem Relation Age of Onset  . COPD Mother   . Heart disease Mother   . Squamous cell carcinoma Mother 54  . COPD Father   . Stroke Paternal Grandfather      Social History   Socioeconomic History  . Marital status: Married    Spouse name: Not on file  . Number of children: Not on file  . Years of education: Not on file  . Highest education level: Not on file  Occupational History  . Not on file  Social Needs  .  Financial resource strain: Not on file  . Food insecurity:    Worry: Not on file    Inability: Not on file  . Transportation needs:    Medical: Not on file    Non-medical: Not on file  Tobacco Use  . Smoking status: Never Smoker  . Smokeless tobacco: Never Used  Substance and Sexual Activity  . Alcohol use: Yes    Comment: occasional  . Drug use: Never  . Sexual activity: Not Currently    Partners: Male  Lifestyle  . Physical activity:    Days per week: Not on file    Minutes per session: Not on file  . Stress: Not on file  Relationships  . Social connections:    Talks on phone: Not on file    Gets together: Not on file    Attends religious service: Not on file    Active member of club or organization: Not on file    Attends meetings of clubs or organizations: Not on file    Relationship status: Not on file  . Intimate partner violence:    Fear of current or ex partner: Not on file    Emotionally abused: Not on file    Physically abused: Not on file    Forced sexual activity: Not on file  Other Topics Concern  . Not on file  Social History Narrative  .  Not on file     PHYSICAL EXAM:  VS: BP 140/88   Pulse 78   Temp 97.8 F (36.6 C) (Oral)   Ht 5\' 4"  (1.626 m)   Wt 259 lb 3.2 oz (117.6 kg)   SpO2 99%   BMI 44.49 kg/m  Physical Exam Gen: NAD, alert, cooperative with exam,  ENT: normal lips, normal nasal mucosa,  Eye: normal EOM, normal conjunctiva and lids CV:  no edema, +2 pedal pulses   Resp: no accessory muscle use, non-labored, wheezing appreciated in lunch fields, no crackles.  Skin: no rashes, no areas of induration  Neuro: normal tone, normal sensation to touch Psych:  normal insight, alert and oriented MSK: normal gait, normal strength      ASSESSMENT & PLAN:   Cough Seems like her problem is more reactive airway with that recent illness that she has been ongoing.  The sinus congestion seems improved with the  antibiotics. -Prednisone. -Albuterol. -If no improvement will consider imaging.

## 2018-04-22 NOTE — Assessment & Plan Note (Signed)
Seems like her problem is more reactive airway with that recent illness that she has been ongoing.  The sinus congestion seems improved with the antibiotics. -Prednisone. -Albuterol. -If no improvement will consider imaging.

## 2018-04-30 ENCOUNTER — Telehealth: Payer: Self-pay

## 2018-04-30 ENCOUNTER — Ambulatory Visit (INDEPENDENT_AMBULATORY_CARE_PROVIDER_SITE_OTHER): Payer: Medicare Other | Admitting: Behavioral Health

## 2018-04-30 DIAGNOSIS — Z7901 Long term (current) use of anticoagulants: Secondary | ICD-10-CM

## 2018-04-30 LAB — POCT INR
INR: 3.3 — AB (ref 2.0–3.0)
INR: 3.3 — AB (ref 2.0–3.0)

## 2018-04-30 NOTE — Telephone Encounter (Signed)
Informed patient of the provider's recommendations below. She verbalized understanding and did not have any further questions or concerns prior to call ending.

## 2018-04-30 NOTE — Patient Instructions (Signed)
Per Dr. Ethelene Hal: Continue taking Coumadin 7.5 mg daily, except 10 mg on Mondays & Thursdays. Recheck INR in 2 weeks.

## 2018-04-30 NOTE — Telephone Encounter (Signed)
Continue current dose.

## 2018-04-30 NOTE — Telephone Encounter (Signed)
INR Range: 2.5-3.5 INR Reading: 3.3 Next Reading: 05/14/2018 Current dosage: Coumadin 10mg  on Monday and Thursdays and 7.5 mg on other days.

## 2018-04-30 NOTE — Progress Notes (Addendum)
INR reported by [facility]. °

## 2018-05-03 ENCOUNTER — Other Ambulatory Visit: Payer: Self-pay

## 2018-05-03 ENCOUNTER — Encounter: Payer: Self-pay | Admitting: Interventional Cardiology

## 2018-05-03 ENCOUNTER — Ambulatory Visit (INDEPENDENT_AMBULATORY_CARE_PROVIDER_SITE_OTHER): Payer: Medicare Other | Admitting: Interventional Cardiology

## 2018-05-03 VITALS — BP 122/82 | HR 81 | Ht 64.0 in | Wt 257.8 lb

## 2018-05-03 DIAGNOSIS — Z952 Presence of prosthetic heart valve: Secondary | ICD-10-CM

## 2018-05-03 DIAGNOSIS — E119 Type 2 diabetes mellitus without complications: Secondary | ICD-10-CM

## 2018-05-03 DIAGNOSIS — E782 Mixed hyperlipidemia: Secondary | ICD-10-CM

## 2018-05-03 NOTE — Progress Notes (Signed)
Cardiology Office Note   Date:  05/03/2018   ID:  Denise Curtis, Denise Curtis 11/11/1955, MRN [id number]  PCP:  Libby Maw, MD    No chief complaint on file.  S/p AVR  Wt Readings from Last 3 Encounters:  05/03/18 257 lb 12.8 oz (116.9 kg)  04/22/18 259 lb 3.2 oz (117.6 kg)  03/04/18 253 lb (114.8 kg)       History of Present Illness: Denise Curtis is a 63 y.o. female who is being seen today for the evaluation of valvular heart disease at the request of Libby Maw,*.  She is here to establish with a cardiologist after valve replacement surgery.  She moved here from [place] in 9/19.  Her husband's job brought her here.  It was a [employer] aortic valve: Serial number: [id number]; Model # [id number]; placed in Mar 05 2014  Done for bicuspid aortic valve.  Per her report, cath was clean prior to surgery.    She has ben following a range of 2.5 - 3.5.   She has a home machine.  Past Medical History:  Diagnosis Date  . Diabetes mellitus type II, non insulin dependent ([place])   . Hyperlipidemia   . Hypertension   . Migraines     Past Surgical History:  Procedure Laterality Date  . APPENDECTOMY  2005  . MECHANICAL AORTIC VALVE REPLACEMENT  2016     Current Outpatient Medications  Medication Sig Dispense Refill  . albuterol (PROVENTIL HFA;VENTOLIN HFA) 108 (90 Base) MCG/ACT inhaler Inhale 1-2 puffs into the lungs every 6 (six) hours as needed for wheezing. 1 Inhaler 0  . atorvastatin (LIPITOR) 40 MG tablet Take 1 tablet (40 mg total) by mouth daily. 90 tablet 1  . clindamycin (CLEOCIN) 300 MG capsule Take 300 mg by mouth 2 (two) times daily. Prior to dental procedure    . empagliflozin (JARDIANCE) 25 MG TABS tablet Take 25 mg by mouth daily. 90 tablet 2  . lisinopril (PRINIVIL,ZESTRIL) 5 MG tablet Take 1 tablet (5 mg total) by mouth daily. 90 tablet 1  . metoprolol succinate (TOPROL-XL) 25 MG 24 hr tablet Take 1 tablet (25 mg total) by mouth  daily. 90 tablet 3  . sitaGLIPtin (JANUVIA) 50 MG tablet Take 1 tablet (50 mg total) by mouth daily. 30 tablet 2  . warfarin (COUMADIN) 5 MG tablet 1 1/2 tablet daily except thursday, take 2 tablets or TAKE AS DIRECTED.  60 day supply 100 tablet 0   No current facility-administered medications for this visit.     Allergies:   Metformin and related; Hydrochlorothiazide; and Penicillins    Social History:  The patient  reports that she has never smoked. She has never used smokeless tobacco. She reports current alcohol use. She reports that she does not use drugs.   Family History:  The patient's family history includes COPD in her father and mother; Heart disease in her mother; Squamous cell carcinoma (age of onset: 74) in her mother; Stroke in her paternal grandfather.    ROS:  Please see the history of present illness.   Otherwise, review of systems are positive for poor lifestyle of late due to mother's illness.   All other systems are reviewed and negative.    PHYSICAL EXAM: VS:  BP 122/82   Pulse 81   Ht 5\' 4"  (1.626 m)   Wt 257 lb 12.8 oz (116.9 kg)   SpO2 96%   BMI 44.25 kg/m  , BMI Body  mass index is 44.25 kg/m. GEN: Well nourished, well developed, in no acute distress  HEENT: normal  Neck: no JVD, carotid bruits, or masses Cardiac: RRR; crisp S2 click; 1/6 systolic murmurs,no  rubs, or gallops,no edema  Respiratory:  clear to auscultation bilaterally, normal work of breathing GI: soft, nontender, nondistended, + BS MS: no deformity or atrophy  Skin: warm and dry, no rash Neuro:  Strength and sensation are intact Psych: euthymic mood, full affect   EKG:   The ekg ordered today demonstrates NSR, nonSpecific ST changes   Recent Labs: 03/04/2018: ALT 26; BUN 19; Creatinine, Ser 0.59; Hemoglobin 14.8; Platelets 173.0; Potassium 4.5; Sodium 137   Lipid Panel    Component Value Date/Time   CHOL 156 03/04/2018 0956   TRIG 177.0 (H) 03/04/2018 0956   HDL 39.00 (L)  03/04/2018 0956   CHOLHDL 4 03/04/2018 0956   VLDL 35.4 03/04/2018 0956   LDLCALC 81 03/04/2018 0956   LDLDIRECT 95.0 03/04/2018 0956     Other studies Reviewed: Additional studies/ records that were reviewed today with results demonstrating: Valve info reviewed.   ASSESSMENT AND PLAN:  1. S/p AVR : SBE prophylaxis with clindamyci.  She is PCN allergic.  Echo in Jan 2021. 2. DM: A1C was 8.5.  On meds. Lifestyle changes should help as well.  3. Morbid obesity: Increase exercise and plant based diet.  4. Hyperlipidemia: The current medical regimen is effective;  continue present plan and medications. TG will come down as glucose decreases.   Current medicines are reviewed at length with the patient today.  The patient concerns regarding her medicines were addressed.  The following changes have been made:  No change  Labs/ tests ordered today include:  No orders of the defined types were placed in this encounter.   Recommend 150 minutes/week of aerobic exercise Low fat, low carb, high fiber diet recommended  Disposition:   FU in 1 year, echo in Jan   Signed, Gino Garrabrant, MD  05/03/2018 10:03 AM    [facility] [address], [place], [place]  [address] Phone: [phone]; Fax: [phone]

## 2018-05-03 NOTE — Patient Instructions (Signed)
Medication Instructions:  Your physician recommends that you continue on your current medications as directed. Please refer to the Current Medication list given to you today.  If you need a refill on your cardiac medications before your next appointment, please call your pharmacy.   Lab work: None Ordered  If you have labs (blood work) drawn today and your tests are completely normal, you will receive your results only by: Marland Kitchen MyChart Message (if you have MyChart) OR . A paper copy in the mail If you have any lab test that is abnormal or we need to change your treatment, we will call you to review the results.  Testing/Procedures: Your physician has requested that you have an echocardiogram in JANUARY 2021. Echocardiography is a painless test that uses sound waves to create images of your heart. It provides your doctor with information about the size and shape of your heart and how well your heart's chambers and valves are working. This procedure takes approximately one hour. There are no restrictions for this procedure.   Follow-Up: At [facility], you and your health needs are our priority.  As part of our continuing mission to provide you with exceptional heart care, we have created designated Provider Care Teams.  These Care Teams include your primary Cardiologist (physician) and Advanced Practice Providers (APPs -  Physician Assistants and Nurse Practitioners) who all work together to provide you with the care you need, when you need it. . You will need a follow up appointment in 1 year.  Please call our office 2 months in advance to schedule this appointment.  You may see Casandra Doffing, MD or one of the following Advanced Practice Providers on your designated Care Team:   . Lyda Jester, PA-C . Dayna Dunn, PA-C . Ermalinda Barrios, PA-C  Any Other Special Instructions Will Be Listed Below (If Applicable).

## 2018-05-09 ENCOUNTER — Other Ambulatory Visit: Payer: Self-pay | Admitting: Family Medicine

## 2018-05-14 ENCOUNTER — Other Ambulatory Visit: Payer: Self-pay | Admitting: Family Medicine

## 2018-05-14 ENCOUNTER — Encounter: Payer: Self-pay | Admitting: Family Medicine

## 2018-05-14 ENCOUNTER — Telehealth: Payer: Self-pay

## 2018-05-14 LAB — POCT INR: INR: 2.4 (ref 2.0–3.0)

## 2018-05-14 NOTE — Telephone Encounter (Signed)
Patient is aware of information below & verbalized understanding.

## 2018-05-14 NOTE — Telephone Encounter (Signed)
Increase Coumadin to 10mg  on Monday, Thursday and Saturday. 7.5mg   On Tuesday, Wednesday, Friday and Sunday. Recheck in 2 weeks.

## 2018-05-14 NOTE — Telephone Encounter (Signed)
Current Reading: 2.4 Range: 2.5-3.5 Current Regimen: Coumadin 10mg  on Monday and Thursdays and 7.5 mg on other days. Next Reading: 4.7.2020

## 2018-05-28 ENCOUNTER — Telehealth: Payer: Self-pay

## 2018-05-28 NOTE — Telephone Encounter (Signed)
We will recheck in 2 weeks.

## 2018-05-28 NOTE — Telephone Encounter (Signed)
Current Reading: 3.4 Range: 2.5-3.5 Current Regimen: 10mg  on Monday, Thursday and Saturday. 7.5mg   On Tuesday, Wednesday, Friday and Sunday. Next Reading: 4.21.2020

## 2018-05-28 NOTE — Telephone Encounter (Signed)
I spoke with patient. We went over the information below and she verbalized understanding. She had some concern with continuing to take 10mg  three days a week, she said that she's never done that before. She will continue to take Coumadin as directed & test again on 4/21.

## 2018-05-28 NOTE — Telephone Encounter (Signed)
Continue current dosing. Follow up on 4/21 as directed.

## 2018-06-02 ENCOUNTER — Other Ambulatory Visit: Payer: Self-pay | Admitting: Family Medicine

## 2018-06-02 DIAGNOSIS — E119 Type 2 diabetes mellitus without complications: Secondary | ICD-10-CM

## 2018-06-11 ENCOUNTER — Telehealth: Payer: Self-pay

## 2018-06-11 ENCOUNTER — Encounter: Payer: Self-pay | Admitting: Family Medicine

## 2018-06-11 LAB — PROTIME-INR

## 2018-06-11 NOTE — Telephone Encounter (Signed)
I left patient a voicemail with the message below & advised her to call the office back with any questions.

## 2018-06-11 NOTE — Telephone Encounter (Signed)
Current Reading: 2.9 Range: 2.5-3.5 Current Regimen:10mg  on Monday, Thursday and Saturday. 7.5mg  On Tuesday, Wednesday, Friday and Sunday. Next Reading: 06/25/2018

## 2018-06-11 NOTE — Telephone Encounter (Signed)
Continue current dose.

## 2018-06-25 ENCOUNTER — Telehealth: Payer: Self-pay

## 2018-06-25 NOTE — Telephone Encounter (Signed)
Patient is aware to continue current regimen.

## 2018-06-25 NOTE — Telephone Encounter (Signed)
Current Reading:3.2 Range: 2.5-3.5 Current Regimen:10mg  on Monday, Thursday and Saturday. 7.5mg  On Tuesday, Wednesday, Friday and Sunday. Next Reading: 07/09/2018

## 2018-07-01 ENCOUNTER — Other Ambulatory Visit: Payer: Self-pay | Admitting: Family Medicine

## 2018-07-09 ENCOUNTER — Telehealth: Payer: Self-pay

## 2018-07-09 MED ORDER — LISINOPRIL 5 MG PO TABS: 5.0000 mg | ORAL_TABLET | Freq: Every day | ORAL | 1 refills | Status: DC

## 2018-07-09 NOTE — Telephone Encounter (Signed)
Current Reading:2.4 Range: 2.5-3.5 Current Regimen:10mg  on Monday, Thursday and Saturday. 7.5mg  On Tuesday, Wednesday, Friday and Sunday. Next Reading:07/23/2018

## 2018-07-09 NOTE — Addendum Note (Signed)
Addended by: Rodrigo Ran on: 07/09/2018 02:29 PM   Modules accepted: Orders

## 2018-07-09 NOTE — Telephone Encounter (Signed)
Spoke with patient, she will continue current dosage. Blood pressure med refilled.

## 2018-07-09 NOTE — Telephone Encounter (Signed)
Yes.   10mg  on Monday, Thursday and Saturday. 7.5mg  on Tues, Wed, Fri and Sunday. Recheck in 2 weeks.

## 2018-07-09 NOTE — Telephone Encounter (Signed)
Increase to 10mg  on Mon, Thur, [place], and Saturday. 7.5mg  on Tues, Wed and Sun. Fu for Inr in 2 weeks.

## 2018-07-09 NOTE — Telephone Encounter (Signed)
I call and spoke with patient. She missed a full dose of the 10mg  tablet last week while traveling. She also had more salads than normal, which could have caused her reading to go up. Patient wants to know if she should continue her current dosage until the next reading or go to what you put below?

## 2018-07-23 ENCOUNTER — Telehealth: Payer: Self-pay

## 2018-07-23 NOTE — Telephone Encounter (Signed)
Current Reading:4.0 Range: 2.5-3.5 Current Regimen:10mg  on Monday, Thursday and Saturday. 7.5mg  On Tuesday, Wednesday, Friday and Sunday. Next Reading:08/06/2018

## 2018-07-23 NOTE — Telephone Encounter (Signed)
Change to 7.5mg  on Tues, Wed, Fri and Sat and Sunday. 10mg  on Mon and Thurs.   Return in 2 weeks for PT and Inr.

## 2018-07-23 NOTE — Telephone Encounter (Signed)
Patient is aware of the new dosage and that we will re-check in 2 weeks.

## 2018-08-06 ENCOUNTER — Telehealth: Payer: Self-pay | Admitting: Family Medicine

## 2018-08-06 NOTE — Telephone Encounter (Signed)
Current Reading:2.8 Range: 2.5-3.5 Current Regimen:7.5mg  on Tues, Wed, Fri and Sat and Sunday. 10mg  on Mon and Thurs.  Next Reading:08/20/2018

## 2018-08-06 NOTE — Telephone Encounter (Signed)
I spoke with pt. She will continue her current regime and test again in 2 weeks.

## 2018-08-20 ENCOUNTER — Telehealth: Payer: Self-pay

## 2018-08-20 NOTE — Telephone Encounter (Signed)
Current Reading: 3.7 Range: 2.5-3.5 Current Regimen: 7.5mg  on Tues, Wed, Fri and Sat and Sunday. 10mg  on Mon and Thurs.  Next Reading: 09/03/2018

## 2018-08-20 NOTE — Telephone Encounter (Signed)
I left patient a detailed voicemail with the information below & advised her to call back with any questions.

## 2018-08-20 NOTE — Telephone Encounter (Signed)
INR: 3.7  Change Coumadin to 7.5mg  on all days but Thursday when she will take 10mg . Follow up in 2 weeks.

## 2018-08-22 ENCOUNTER — Other Ambulatory Visit: Payer: Self-pay | Admitting: Family Medicine

## 2018-08-25 ENCOUNTER — Other Ambulatory Visit: Payer: Self-pay | Admitting: Family Medicine

## 2018-08-25 DIAGNOSIS — E119 Type 2 diabetes mellitus without complications: Secondary | ICD-10-CM

## 2018-09-03 ENCOUNTER — Telehealth: Payer: Self-pay

## 2018-09-03 ENCOUNTER — Encounter: Payer: Self-pay | Admitting: Family Medicine

## 2018-09-03 MED ORDER — ATORVASTATIN CALCIUM 40 MG PO TABS: 40.0000 mg | ORAL_TABLET | Freq: Every day | ORAL | 1 refills | Status: DC

## 2018-09-03 NOTE — Telephone Encounter (Signed)
Continue current dose.

## 2018-09-03 NOTE — Telephone Encounter (Signed)
I left pt a voicemail with the instructions below & advised her to call back with any questions.

## 2018-09-03 NOTE — Telephone Encounter (Signed)
Range: 2.5-3.5 Current Reading: 3.0 Current Regimen: 7.5mg  on all days but Thursday when she will take 10mg .  Next Reading: 09/17/2018

## 2018-10-01 ENCOUNTER — Telehealth: Payer: Self-pay

## 2018-10-01 ENCOUNTER — Encounter: Payer: Self-pay | Admitting: Family Medicine

## 2018-10-01 LAB — POCT INR

## 2018-10-01 NOTE — Telephone Encounter (Signed)
Range: 2.5-3.5 Current Reading: 3.9 Current Regimen: 7.5mg  on all days but Thursday when she will take 10mg .  Next Reading: 10/15/2018

## 2018-10-02 NOTE — Telephone Encounter (Signed)
I spoke to pt. She will start taking 7.5 mg daily. Pt has been traveling and stated that her INR does seem to go up when she travels. She will re-check in 2 weeks.

## 2018-10-02 NOTE — Telephone Encounter (Signed)
Change Coumadin schedule to 7.5mg  every day. Recheck PT/INR in 2 weeks.

## 2018-10-15 ENCOUNTER — Telehealth (INDEPENDENT_AMBULATORY_CARE_PROVIDER_SITE_OTHER): Payer: Medicare Other

## 2018-10-15 DIAGNOSIS — Z7901 Long term (current) use of anticoagulants: Secondary | ICD-10-CM

## 2018-10-15 NOTE — Telephone Encounter (Signed)
Range: 2.5-3.5 Current Reading: 2.6 Current Regimen: 7.5mg  on all days Next Reading: 09/08/020

## 2018-10-15 NOTE — Addendum Note (Signed)
Addended by: Rodrigo Ran on: 10/15/2018 10:43 AM   Modules accepted: Orders

## 2018-10-15 NOTE — Telephone Encounter (Signed)
I spoke with pt. We went over the results and she is requesting to have her next reading done in our lab so she can compare it with her machine to make sure it's working correctly. Lab appt made & future order entered.

## 2018-10-15 NOTE — Addendum Note (Signed)
Addended by: Jon Billings on: 10/15/2018 10:37 AM   Modules accepted: Level of Service

## 2018-10-19 ENCOUNTER — Other Ambulatory Visit: Payer: Self-pay | Admitting: Family Medicine

## 2018-10-29 ENCOUNTER — Other Ambulatory Visit: Payer: Self-pay

## 2018-10-29 ENCOUNTER — Other Ambulatory Visit (INDEPENDENT_AMBULATORY_CARE_PROVIDER_SITE_OTHER): Payer: Medicare Other

## 2018-10-29 DIAGNOSIS — Z7901 Long term (current) use of anticoagulants: Secondary | ICD-10-CM | POA: Diagnosis not present

## 2018-10-29 LAB — PROTIME-INR
INR: 2.5 ratio — ABNORMAL HIGH (ref 0.8–1.0)
Prothrombin Time: 29.2 s — ABNORMAL HIGH (ref 9.6–13.1)

## 2018-10-30 ENCOUNTER — Telehealth: Payer: Self-pay

## 2018-10-30 NOTE — Telephone Encounter (Signed)
I called and spoke with pt. We went over information below & she verbalized understanding.

## 2018-10-30 NOTE — Telephone Encounter (Signed)
Continue current regimen

## 2018-10-30 NOTE — Telephone Encounter (Signed)
Range: 2.5-3.5 Current Reading: 2.6 Current Regimen:7.5mg  on all days Next Reading: 11/12/2018

## 2018-11-12 ENCOUNTER — Telehealth: Payer: Self-pay

## 2018-11-12 LAB — POCT INR

## 2018-11-12 NOTE — Telephone Encounter (Signed)
Increase Coumadin to 10mg  on Monday and Wednesday and 7.5mg  on all other days. Follow up in 2 weeks after starting this regimen.

## 2018-11-12 NOTE — Telephone Encounter (Signed)
Range: 2.5-3.5 Current Reading:2.2 Current Regimen:7.5mg  on all days Next Reading:11/26/2018

## 2018-11-12 NOTE — Telephone Encounter (Signed)
I spoke with pt. We went over the results below and she will adjust her Coumadin as directed & retest in 2 weeks.

## 2018-11-25 ENCOUNTER — Other Ambulatory Visit: Payer: Self-pay | Admitting: Family Medicine

## 2018-11-26 ENCOUNTER — Telehealth: Payer: Self-pay

## 2018-11-26 LAB — POCT INR

## 2018-11-26 NOTE — Telephone Encounter (Signed)
Range: 2.5-3.5 Current Reading: 2.9 Current Regimen: increase Coumadin to 10mg  on Monday and Wednesday and 7.5mg  on all other days.  Next Reading: 12/10/2018

## 2018-11-26 NOTE — Telephone Encounter (Signed)
Continue current dosing

## 2018-11-26 NOTE — Telephone Encounter (Signed)
I spoke with pt, she is aware to continue current dosing and recheck in 2 weeks.

## 2018-12-12 ENCOUNTER — Other Ambulatory Visit: Payer: Self-pay | Admitting: Family Medicine

## 2018-12-12 DIAGNOSIS — E119 Type 2 diabetes mellitus without complications: Secondary | ICD-10-CM

## 2018-12-12 MED ORDER — SITAGLIPTIN PHOSPHATE 50 MG PO TABS: 50.0000 mg | ORAL_TABLET | Freq: Every day | ORAL | 0 refills | Status: DC

## 2018-12-20 ENCOUNTER — Other Ambulatory Visit: Payer: Self-pay | Admitting: Family Medicine

## 2019-01-03 ENCOUNTER — Other Ambulatory Visit: Payer: Self-pay | Admitting: Family Medicine

## 2019-01-14 ENCOUNTER — Other Ambulatory Visit: Payer: Self-pay | Admitting: Family Medicine

## 2019-01-14 ENCOUNTER — Encounter: Payer: Self-pay | Admitting: Family Medicine

## 2019-01-14 ENCOUNTER — Telehealth (INDEPENDENT_AMBULATORY_CARE_PROVIDER_SITE_OTHER): Payer: Medicare Other

## 2019-01-14 DIAGNOSIS — Z7901 Long term (current) use of anticoagulants: Secondary | ICD-10-CM | POA: Diagnosis not present

## 2019-01-14 LAB — POCT INR

## 2019-01-14 NOTE — Telephone Encounter (Signed)
I spoke with pt. She will use the regimen listed below & re-check in 2 weeks.

## 2019-01-14 NOTE — Telephone Encounter (Signed)
lmovm x 1 for pt to return my call. I don't see a previous INR reading for November, need to confirm her current regimen.

## 2019-01-14 NOTE — Telephone Encounter (Signed)
Range: 2.5-3.5 Current Reading: 2.4 Current Regimen:increase Coumadin to 10mg  on Monday and Wednesday and 7.5mg  on all other days.  Next test date: 01/28/2019

## 2019-01-14 NOTE — Addendum Note (Signed)
Addended by: [name] A on: 01/14/2019 01:04 PM   Modules accepted: Level of Service

## 2019-01-28 LAB — POCT INR: INR: 2.9 (ref 2.0–3.0)

## 2019-01-30 ENCOUNTER — Ambulatory Visit (INDEPENDENT_AMBULATORY_CARE_PROVIDER_SITE_OTHER): Payer: Medicare Other | Admitting: Behavioral Health

## 2019-01-30 DIAGNOSIS — Z7901 Long term (current) use of anticoagulants: Secondary | ICD-10-CM | POA: Diagnosis not present

## 2019-01-30 NOTE — Progress Notes (Signed)
INR reported by [facility]. °

## 2019-01-30 NOTE — Patient Instructions (Signed)
Per Dr. Ethelene Hal: Take Coumadin 10 MG on Monday, Wednesday, & Friday and 7.5 MG all other days. Recheck INR in 2 weeks.

## 2019-02-03 ENCOUNTER — Telehealth: Payer: Self-pay

## 2019-02-03 NOTE — Telephone Encounter (Signed)
Attempted to reach patient to discuss provider's recommendations regarding last INR reading. No answer at the time of call x2. Unable to leave message; voice mailbox is full. Will try calling again later.

## 2019-02-03 NOTE — Telephone Encounter (Signed)
I called pt and informed her that I didn't try to call her. She states she thought Judson Roch was calling her about her INR. Informed her that she was not at this office but I would send a message to you to see if you were trying to reach this pt.     Copied from [place] [phone]. Topic: General - Other >> Feb 03, 2019 11:13 AM Celene Kras wrote: Reason for CRM: Pt called stating she received a call with no vm and is requesting a call back. Please advise.

## 2019-02-05 ENCOUNTER — Other Ambulatory Visit: Payer: Self-pay | Admitting: Family Medicine

## 2019-02-05 DIAGNOSIS — E1159 Type 2 diabetes mellitus with other circulatory complications: Secondary | ICD-10-CM

## 2019-02-05 DIAGNOSIS — I152 Hypertension secondary to endocrine disorders: Secondary | ICD-10-CM

## 2019-02-05 NOTE — Telephone Encounter (Signed)
Called & informed patient of the provider's recommendations regarding last INR check. Patient voiced understanding and did not have any further questions or concerns before call ended.  Per Dr. Ethelene Hal: Take Coumadin 10 MG on Monday, Wednesday, & Friday and 7.5 MG all other days. Recheck INR in 2 weeks.

## 2019-02-11 ENCOUNTER — Encounter: Payer: Self-pay | Admitting: Family Medicine

## 2019-02-11 ENCOUNTER — Telehealth (INDEPENDENT_AMBULATORY_CARE_PROVIDER_SITE_OTHER): Payer: Medicare Other | Admitting: Family Medicine

## 2019-02-11 VITALS — Ht 64.0 in | Wt 260.0 lb

## 2019-02-11 DIAGNOSIS — E119 Type 2 diabetes mellitus without complications: Secondary | ICD-10-CM

## 2019-02-11 DIAGNOSIS — E785 Hyperlipidemia, unspecified: Secondary | ICD-10-CM

## 2019-02-11 DIAGNOSIS — Z0000 Encounter for general adult medical examination without abnormal findings: Secondary | ICD-10-CM | POA: Diagnosis not present

## 2019-02-11 DIAGNOSIS — E1169 Type 2 diabetes mellitus with other specified complication: Secondary | ICD-10-CM | POA: Diagnosis not present

## 2019-02-11 DIAGNOSIS — E1159 Type 2 diabetes mellitus with other circulatory complications: Secondary | ICD-10-CM | POA: Diagnosis not present

## 2019-02-11 DIAGNOSIS — I10 Essential (primary) hypertension: Secondary | ICD-10-CM

## 2019-02-11 DIAGNOSIS — I152 Hypertension secondary to endocrine disorders: Secondary | ICD-10-CM

## 2019-02-11 LAB — POCT INR: INR: 2.6 (ref 2.0–3.0)

## 2019-02-11 MED ORDER — ATORVASTATIN CALCIUM 40 MG PO TABS: 40.0000 mg | ORAL_TABLET | Freq: Every day | ORAL | 1 refills | Status: DC

## 2019-02-11 NOTE — Progress Notes (Signed)
Established Patient Office Visit  Subjective:  Patient ID: Denise Curtis, female    DOB: 07/26/1955  Age: 63 y.o. MRN: [id number]  CC:  Chief Complaint  Patient presents with  . Medication Refill    lipitor    HPI Denise Curtis presents for follow-up of her diabetes, elevated cholesterol and hypertension.  She actually has an appointment scheduled with me on 14 January.  She has been lost to follow-up for almost a year now.  Tells me that she had been the primary caregiver for her mother who passed away this past 07-25-22.  She is also scheduled to follow-up with cardiology on the 14th for a repeat echocardiogram regarding her a aortic valve replacement.  Past Medical History:  Diagnosis Date  . Diabetes mellitus type II, non insulin dependent ([place])   . Hyperlipidemia   . Hypertension   . Migraines     Past Surgical History:  Procedure Laterality Date  . APPENDECTOMY  2005  . MECHANICAL AORTIC VALVE REPLACEMENT  2016    Family History  Problem Relation Age of Onset  . COPD Mother   . Heart disease Mother   . Squamous cell carcinoma Mother 51  . COPD Father   . Stroke Paternal Grandfather     Social History   Socioeconomic History  . Marital status: Married    Spouse name: Not on file  . Number of children: Not on file  . Years of education: Not on file  . Highest education level: Not on file  Occupational History  . Not on file  Tobacco Use  . Smoking status: Never Smoker  . Smokeless tobacco: Never Used  Substance and Sexual Activity  . Alcohol use: Yes    Comment: occasional  . Drug use: Never  . Sexual activity: Not Currently    Partners: Male  Other Topics Concern  . Not on file  Social History Narrative  . Not on file   Social Determinants of Health   Financial Resource Strain:   . Difficulty of Paying Living Expenses: Not on file  Food Insecurity:   . Worried About Charity fundraiser in the Last Year: Not on file  . Ran Out of Food  in the Last Year: Not on file  Transportation Needs:   . Lack of Transportation (Medical): Not on file  . Lack of Transportation (Non-Medical): Not on file  Physical Activity:   . Days of Exercise per Week: Not on file  . Minutes of Exercise per Session: Not on file  Stress:   . Feeling of Stress : Not on file  Social Connections:   . Frequency of Communication with Friends and Family: Not on file  . Frequency of Social Gatherings with Friends and Family: Not on file  . Attends Religious Services: Not on file  . Active Member of Clubs or Organizations: Not on file  . Attends Archivist Meetings: Not on file  . Marital Status: Not on file  Intimate Partner Violence:   . Fear of Current or Ex-Partner: Not on file  . Emotionally Abused: Not on file  . Physically Abused: Not on file  . Sexually Abused: Not on file    Outpatient Medications Prior to Visit  Medication Sig Dispense Refill  . empagliflozin (JARDIANCE) 25 MG TABS tablet Take 25 mg by mouth daily. 90 tablet 2  . lisinopril (ZESTRIL) 5 MG tablet TAKE 1 TABLET BY MOUTH EVERY DAY 90 tablet 1  . metoprolol succinate (  TOPROL-XL) 25 MG 24 hr tablet Take 1 tablet (25 mg total) by mouth daily. 90 tablet 3  . sitaGLIPtin (JANUVIA) 50 MG tablet Take 1 tablet (50 mg total) by mouth daily. 90 tablet 0  . warfarin (COUMADIN) 5 MG tablet TAKE AS DIRECTED. 60 DAY SUPPLY 100 tablet 2  . atorvastatin (LIPITOR) 40 MG tablet Take 1 tablet (40 mg total) by mouth daily. 90 tablet 1  . albuterol (PROVENTIL HFA;VENTOLIN HFA) 108 (90 Base) MCG/ACT inhaler Inhale 1-2 puffs into the lungs every 6 (six) hours as needed for wheezing. (Patient not taking: Reported on 02/11/2019) 1 Inhaler 0  . clindamycin (CLEOCIN) 300 MG capsule Take 300 mg by mouth 2 (two) times daily. Prior to dental procedure     No facility-administered medications prior to visit.    Allergies  Allergen Reactions  . Metformin And Related Nausea And Vomiting  .  Hydrochlorothiazide Anxiety  . Penicillins Rash    ROS Review of Systems  Constitutional: Negative.   Respiratory: Negative.   Cardiovascular: Negative.   Gastrointestinal: Negative.   Endocrine: Negative for polyphagia and polyuria.  Neurological: Negative.   Psychiatric/Behavioral: Negative.       Objective:    Physical Exam  Constitutional: She is oriented to person, place, and time. She appears well-developed and well-nourished. No distress.  HENT:  Head: Normocephalic and atraumatic.  Right Ear: External ear normal.  Eyes: Conjunctivae are normal. Right eye exhibits no discharge. Left eye exhibits no discharge.  Neck: No JVD present. No tracheal deviation present.  Pulmonary/Chest: Effort normal. No stridor.  Neurological: She is alert and oriented to person, place, and time.  Skin: She is not diaphoretic.  Psychiatric: She has a normal mood and affect. Her behavior is normal.    Ht 5\' 4"  (1.626 m)   Wt 260 lb (117.9 kg)   BMI 44.63 kg/m  Wt Readings from Last 3 Encounters:  02/11/19 260 lb (117.9 kg)  05/03/18 257 lb 12.8 oz (116.9 kg)  04/22/18 259 lb 3.2 oz (117.6 kg)     Health Maintenance Due  Topic Date Due  . Hepatitis C Screening  1955-11-21  . OPHTHALMOLOGY EXAM  04/13/1963  . HIV Screening  04/12/1968  . TETANUS/TDAP  04/12/1972  . PAP SMEAR-Modifier  04/12/1974  . COLONOSCOPY  04/13/2003  . DEXA SCAN  04/12/2018  . PNA vac Low Risk Adult (1 of 2 - PCV13) 04/12/2018  . HEMOGLOBIN A1C  09/02/2018  . INFLUENZA VACCINE  09/21/2018  . FOOT EXAM  12/07/2018    There are no preventive care reminders to display for this patient.  No results found for: TSH Lab Results  Component Value Date   WBC 6.4 03/04/2018   HGB 14.8 03/04/2018   HCT 44.8 03/04/2018   MCV 83.1 03/04/2018   PLT 173.0 03/04/2018   Lab Results  Component Value Date   NA 137 03/04/2018   K 4.5 03/04/2018   CO2 26 03/04/2018   GLUCOSE 208 (H) 03/04/2018   BUN 19  03/04/2018   CREATININE 0.59 03/04/2018   BILITOT 0.6 03/04/2018   ALKPHOS 99 03/04/2018   AST 16 03/04/2018   ALT 26 03/04/2018   PROT 6.6 03/04/2018   ALBUMIN 4.0 03/04/2018   CALCIUM 9.1 03/04/2018   GFR 108.76 03/04/2018   Lab Results  Component Value Date   CHOL 156 03/04/2018   Lab Results  Component Value Date   HDL 39.00 (L) 03/04/2018   Lab Results  Component Value Date  [place] 81 03/04/2018   Lab Results  Component Value Date   TRIG 177.0 (H) 03/04/2018   Lab Results  Component Value Date   CHOLHDL 4 03/04/2018   Lab Results  Component Value Date   HGBA1C 8.5 (A) 03/04/2018      Assessment & Plan:   Problem List Items Addressed This Visit      Endocrine   Diabetes mellitus type II, non insulin dependent ([place]) - Primary   Relevant Medications   atorvastatin (LIPITOR) 40 MG tablet   Other Relevant Orders   Ambulatory referral to Ophthalmology    Other Visit Diagnoses    Hyperlipidemia associated with type 2 diabetes mellitus ([place])       Relevant Medications   atorvastatin (LIPITOR) 40 MG tablet   Hypertension associated with diabetes ([place])       Relevant Medications   atorvastatin (LIPITOR) 40 MG tablet   Healthcare maintenance       Relevant Orders   Ambulatory referral to Gynecology      Meds ordered this encounter  Medications  . atorvastatin (LIPITOR) 40 MG tablet    Sig: Take 1 tablet (40 mg total) by mouth daily.    Dispense:  90 tablet    Refill:  1    Follow-up: Return in about 23 days (around 03/06/2019).   We spoke of the importance of regular follow-up.  She has been stressed this year. Libby Maw, MD   Virtual Visit via Video Note  I connected with Carlissa Butterly on 02/11/19 at 10:30 AM EST by a video enabled telemedicine application and verified that I am speaking with the correct person using two identifiers.  Location: Patient: home alone Provider:    I discussed the limitations of evaluation  and management by telemedicine and the availability of in person appointments. The patient expressed understanding and agreed to proceed.  History of Present Illness:    Observations/Objective:   Assessment and Plan:   Follow Up Instructions:    I discussed the assessment and treatment plan with the patient. The patient was provided an opportunity to ask questions and all were answered. The patient agreed with the plan and demonstrated an understanding of the instructions.   The patient was advised to call back or seek an in-person evaluation if the symptoms worsen or if the condition fails to improve as anticipated.  I provided 20 minutes of non-face-to-face time during this encounter.   Libby Maw, MD

## 2019-02-12 ENCOUNTER — Ambulatory Visit (INDEPENDENT_AMBULATORY_CARE_PROVIDER_SITE_OTHER): Payer: Medicare Other | Admitting: Behavioral Health

## 2019-02-12 DIAGNOSIS — Z7901 Long term (current) use of anticoagulants: Secondary | ICD-10-CM | POA: Diagnosis not present

## 2019-02-12 NOTE — Patient Instructions (Signed)
Per Dr. Ethelene Hal: Take Coumadin 10 MG on Monday, Wednesday, & Friday and 7.5 MG all other days. Recheck INR in 2 weeks.

## 2019-02-12 NOTE — Progress Notes (Signed)
INR reported by [facility]. °

## 2019-02-25 LAB — POCT INR: INR: 2.2 (ref 2.0–3.0)

## 2019-02-27 ENCOUNTER — Ambulatory Visit (INDEPENDENT_AMBULATORY_CARE_PROVIDER_SITE_OTHER): Payer: Self-pay | Admitting: Behavioral Health

## 2019-02-27 DIAGNOSIS — Z7901 Long term (current) use of anticoagulants: Secondary | ICD-10-CM

## 2019-02-27 NOTE — Patient Instructions (Signed)
Per Dr. Ethelene Hal: Take Coumadin 10 MG daily, except on Saturday & Sunday take Coumadin 7.5 MG. Recheck INR in 2 weeks.

## 2019-02-27 NOTE — Progress Notes (Signed)
INR reported by [facility]. °

## 2019-03-05 ENCOUNTER — Other Ambulatory Visit: Payer: Self-pay

## 2019-03-05 ENCOUNTER — Ambulatory Visit (HOSPITAL_COMMUNITY): Payer: Medicare HMO | Attending: Cardiology

## 2019-03-05 DIAGNOSIS — Z952 Presence of prosthetic heart valve: Secondary | ICD-10-CM | POA: Insufficient documentation

## 2019-03-06 ENCOUNTER — Encounter: Payer: Self-pay | Admitting: Family Medicine

## 2019-03-06 ENCOUNTER — Other Ambulatory Visit (HOSPITAL_BASED_OUTPATIENT_CLINIC_OR_DEPARTMENT_OTHER): Payer: Self-pay | Admitting: Family Medicine

## 2019-03-06 ENCOUNTER — Other Ambulatory Visit: Payer: Self-pay

## 2019-03-06 ENCOUNTER — Ambulatory Visit (INDEPENDENT_AMBULATORY_CARE_PROVIDER_SITE_OTHER): Payer: Medicare HMO | Admitting: Family Medicine

## 2019-03-06 VITALS — BP 152/82 | HR 89 | Temp 96.9°F | Ht 64.0 in | Wt 256.4 lb

## 2019-03-06 DIAGNOSIS — E119 Type 2 diabetes mellitus without complications: Secondary | ICD-10-CM | POA: Diagnosis not present

## 2019-03-06 DIAGNOSIS — E1169 Type 2 diabetes mellitus with other specified complication: Secondary | ICD-10-CM | POA: Diagnosis not present

## 2019-03-06 DIAGNOSIS — Z0000 Encounter for general adult medical examination without abnormal findings: Secondary | ICD-10-CM | POA: Diagnosis not present

## 2019-03-06 DIAGNOSIS — E785 Hyperlipidemia, unspecified: Secondary | ICD-10-CM | POA: Diagnosis not present

## 2019-03-06 DIAGNOSIS — Z23 Encounter for immunization: Secondary | ICD-10-CM | POA: Diagnosis not present

## 2019-03-06 DIAGNOSIS — E1159 Type 2 diabetes mellitus with other circulatory complications: Secondary | ICD-10-CM | POA: Diagnosis not present

## 2019-03-06 DIAGNOSIS — G8929 Other chronic pain: Secondary | ICD-10-CM | POA: Diagnosis not present

## 2019-03-06 DIAGNOSIS — M25561 Pain in right knee: Secondary | ICD-10-CM

## 2019-03-06 DIAGNOSIS — I10 Essential (primary) hypertension: Secondary | ICD-10-CM | POA: Diagnosis not present

## 2019-03-06 DIAGNOSIS — Z952 Presence of prosthetic heart valve: Secondary | ICD-10-CM

## 2019-03-06 DIAGNOSIS — I152 Hypertension secondary to endocrine disorders: Secondary | ICD-10-CM

## 2019-03-06 DIAGNOSIS — Z1231 Encounter for screening mammogram for malignant neoplasm of breast: Secondary | ICD-10-CM

## 2019-03-06 MED ORDER — LISINOPRIL 10 MG PO TABS: 10.0000 mg | ORAL_TABLET | Freq: Every day | ORAL | 3 refills | Status: DC

## 2019-03-06 NOTE — Progress Notes (Addendum)
Established Patient Office Visit  Subjective:  Patient ID: Denise Curtis, female    DOB: 31-Jul-1955  Age: 64 y.o. MRN: [id number]  CC:  Chief Complaint  Patient presents with  . Annual Exam    CPE, pt states that her right knee have started bothering would like this checked.     HPI Denise Curtis presents for follow-up of her hypertension, diabetes and elevated cholesterol.  Patient is not currently checking her blood pressure at home but admits to dietary indiscretion associated with the pain glutamic.  She has been taking her metoprolol and Zestril daily.  Taking her atorvastatin daily.  Taking Jardiance and Januvia daily as directed.  She has been having problems with her right knee.  She feels as though it wants to give way at times.  She does not trust it walking downstairs.  Was unable to go for her GYN appointment because there were no more openings with a female provider.  Past Medical History:  Diagnosis Date  . Diabetes mellitus type II, non insulin dependent ([place])   . Hyperlipidemia   . Hypertension   . Migraines     Past Surgical History:  Procedure Laterality Date  . APPENDECTOMY  2005  . MECHANICAL AORTIC VALVE REPLACEMENT  2016    Family History  Problem Relation Age of Onset  . COPD Mother   . Heart disease Mother   . Squamous cell carcinoma Mother 69  . COPD Father   . Stroke Paternal Grandfather     Social History   Socioeconomic History  . Marital status: Married    Spouse name: Not on file  . Number of children: Not on file  . Years of education: Not on file  . Highest education level: Not on file  Occupational History  . Not on file  Tobacco Use  . Smoking status: Never Smoker  . Smokeless tobacco: Never Used  Substance and Sexual Activity  . Alcohol use: Yes    Comment: occasional  . Drug use: Never  . Sexual activity: Not Currently    Partners: Male  Other Topics Concern  . Not on file  Social History Narrative  . Not  on file   Social Determinants of Health   Financial Resource Strain:   . Difficulty of Paying Living Expenses: Not on file  Food Insecurity:   . Worried About Charity fundraiser in the Last Year: Not on file  . Ran Out of Food in the Last Year: Not on file  Transportation Needs:   . Lack of Transportation (Medical): Not on file  . Lack of Transportation (Non-Medical): Not on file  Physical Activity:   . Days of Exercise per Week: Not on file  . Minutes of Exercise per Session: Not on file  Stress:   . Feeling of Stress : Not on file  Social Connections:   . Frequency of Communication with Friends and Family: Not on file  . Frequency of Social Gatherings with Friends and Family: Not on file  . Attends Religious Services: Not on file  . Active Member of Clubs or Organizations: Not on file  . Attends Archivist Meetings: Not on file  . Marital Status: Not on file  Intimate Partner Violence:   . Fear of Current or Ex-Partner: Not on file  . Emotionally Abused: Not on file  . Physically Abused: Not on file  . Sexually Abused: Not on file    Outpatient Medications Prior to Visit  Medication Sig Dispense Refill  . albuterol (PROVENTIL HFA;VENTOLIN HFA) 108 (90 Base) MCG/ACT inhaler Inhale 1-2 puffs into the lungs every 6 (six) hours as needed for wheezing. 1 Inhaler 0  . atorvastatin (LIPITOR) 40 MG tablet Take 1 tablet (40 mg total) by mouth daily. 90 tablet 1  . metoprolol succinate (TOPROL-XL) 25 MG 24 hr tablet Take 1 tablet (25 mg total) by mouth daily. 90 tablet 3  . warfarin (COUMADIN) 5 MG tablet TAKE AS DIRECTED. 60 DAY SUPPLY 100 tablet 2  . empagliflozin (JARDIANCE) 25 MG TABS tablet Take 25 mg by mouth daily. 90 tablet 2  . lisinopril (ZESTRIL) 5 MG tablet TAKE 1 TABLET BY MOUTH EVERY DAY 90 tablet 1  . sitaGLIPtin (JANUVIA) 50 MG tablet Take 1 tablet (50 mg total) by mouth daily. 90 tablet 0  . clindamycin (CLEOCIN) 300 MG capsule Take 300 mg by mouth 2 (two)  times daily. Prior to dental procedure     No facility-administered medications prior to visit.    Allergies  Allergen Reactions  . Metformin And Related Nausea And Vomiting  . Hydrochlorothiazide Anxiety  . Penicillins Rash    ROS Review of Systems  Constitutional: Negative for chills, diaphoresis, fatigue, fever and unexpected weight change.  HENT: Negative.   Eyes: Negative for photophobia and visual disturbance.  Respiratory: Negative.   Cardiovascular: Negative.   Gastrointestinal: Negative.   Endocrine: Negative for polyphagia and polyuria.  Genitourinary: Negative.   Musculoskeletal: Positive for arthralgias and gait problem.  Skin: Negative for pallor and rash.  Allergic/Immunologic: Negative for immunocompromised state.  Neurological: Negative for light-headedness and headaches.  Hematological: Does not bruise/bleed easily.  Psychiatric/Behavioral: Negative.       Objective:    Physical Exam  Constitutional: She is oriented to person, place, and time. She appears well-developed and well-nourished. No distress.  HENT:  Head: Normocephalic and atraumatic.  Right Ear: External ear normal.  Left Ear: External ear normal.  Eyes: Conjunctivae are normal. Right eye exhibits no discharge. Left eye exhibits no discharge. No scleral icterus.  Neck: No JVD present. No tracheal deviation present. No thyromegaly present.  Cardiovascular: Normal rate, regular rhythm and normal heart sounds.  Pulmonary/Chest: Effort normal and breath sounds normal. No stridor.  Abdominal: Bowel sounds are normal.  Musculoskeletal:        General: No edema.     Right knee: No swelling or effusion. Normal range of motion. No tenderness.  Lymphadenopathy:    She has no cervical adenopathy.  Neurological: She is alert and oriented to person, place, and time.  Skin: Skin is warm and dry. She is not diaphoretic.  Psychiatric: She has a normal mood and affect. Her behavior is normal.    BP  (!) 152/82   Pulse 89   Temp (!) 96.9 F (36.1 C) (Tympanic)   Ht 5' 4"  (1.626 m)   Wt 256 lb 6.4 oz (116.3 kg)   SpO2 99%   BMI 44.01 kg/m  Wt Readings from Last 3 Encounters:  03/06/19 256 lb 6.4 oz (116.3 kg)  02/11/19 260 lb (117.9 kg)  05/03/18 257 lb 12.8 oz (116.9 kg)     Health Maintenance Due  Topic Date Due  . Hepatitis C Screening  11-04-1955  . OPHTHALMOLOGY EXAM  04/13/1963  . HIV Screening  04/12/1968  . TETANUS/TDAP  04/12/1972  . PAP SMEAR-Modifier  04/12/1974  . COLONOSCOPY  04/13/2003  . DEXA SCAN  04/12/2018  . PNA vac Low Risk Adult (1 of  2 - PCV13) 04/12/2018  . HEMOGLOBIN A1C  09/02/2018  . INFLUENZA VACCINE  09/21/2018  . FOOT EXAM  12/07/2018    There are no preventive care reminders to display for this patient.  No results found for: TSH Lab Results  Component Value Date   WBC 6.4 03/04/2018   HGB 14.8 03/04/2018   HCT 44.8 03/04/2018   MCV 83.1 03/04/2018   PLT 173.0 03/04/2018   Lab Results  Component Value Date   NA 137 03/04/2018   K 4.5 03/04/2018   CO2 26 03/04/2018   GLUCOSE 208 (H) 03/04/2018   BUN 19 03/04/2018   CREATININE 0.59 03/04/2018   BILITOT 0.6 03/04/2018   ALKPHOS 99 03/04/2018   AST 16 03/04/2018   ALT 26 03/04/2018   PROT 6.6 03/04/2018   ALBUMIN 4.0 03/04/2018   CALCIUM 9.1 03/04/2018   GFR 108.76 03/04/2018   Lab Results  Component Value Date   CHOL 156 03/04/2018   Lab Results  Component Value Date   HDL 39.00 (L) 03/04/2018   Lab Results  Component Value Date   LDLCALC 81 03/04/2018   Lab Results  Component Value Date   TRIG 177.0 (H) 03/04/2018   Lab Results  Component Value Date   CHOLHDL 4 03/04/2018   Lab Results  Component Value Date   HGBA1C 8.5 (A) 03/04/2018      Assessment & Plan:   Problem List Items Addressed This Visit      Cardiovascular and Mediastinum   Hypertension associated with diabetes ([place]) - Primary   Relevant Medications   lisinopril (ZESTRIL) 10 MG  tablet   empagliflozin (JARDIANCE) 25 MG TABS tablet   Other Relevant Orders   CBC   Urinalysis, Routine w reflex microscopic   Comp Met (CMET)     Endocrine   Diabetes mellitus type II, non insulin dependent ([facility])   Relevant Medications   lisinopril (ZESTRIL) 10 MG tablet   empagliflozin (JARDIANCE) 25 MG TABS tablet   Other Relevant Orders   CBC   HgB A1c   Urine Microalbumin w/creat. ratio   Urinalysis, Routine w reflex microscopic   Comp Met (CMET)   Ambulatory referral to Ophthalmology   Hyperlipidemia associated with type 2 diabetes mellitus ([place])   Relevant Medications   lisinopril (ZESTRIL) 10 MG tablet   empagliflozin (JARDIANCE) 25 MG TABS tablet   Other Relevant Orders   Direct LDL   Comp Met (CMET)     Other   Healthcare maintenance   Relevant Orders   Ambulatory referral to Gynecology   Morbid obesity ([place])   Relevant Medications   empagliflozin (JARDIANCE) 25 MG TABS tablet   Other Relevant Orders   Amb ref to Medical Nutrition Therapy-MNT   Chronic pain of right knee   Relevant Orders   Ambulatory referral to Sports Medicine   DG Knee Complete 4 Views Right      Meds ordered this encounter  Medications  . lisinopril (ZESTRIL) 10 MG tablet    Sig: Take 1 tablet (10 mg total) by mouth daily.    Dispense:  90 tablet    Refill:  3  . empagliflozin (JARDIANCE) 25 MG TABS tablet    Sig: Take 25 mg by mouth daily.    Dispense:  90 tablet    Refill:  0    Follow-up: Return in about 3 months (around 06/04/2019), or return fasting for blood work.Rozetta Nunnery to go for nutrition counseling.  We will try a different  GYN group.  She has a Pharmacist, community and is planning on follow-up there.  Stressed the importance of timely follow-up. Libby Maw, MD

## 2019-03-07 NOTE — Addendum Note (Signed)
Addended by: [name] A on: 03/07/2019 11:04 AM   Modules accepted: Orders

## 2019-03-10 ENCOUNTER — Telehealth: Payer: Self-pay | Admitting: Family Medicine

## 2019-03-10 NOTE — Telephone Encounter (Signed)
Patient is calling and stated that she is scheduled to take the Covid vaccine and just had the flu shot. Pt wanted to know if this would be ok or if she should wait to get the vaccine. CB is [phone]

## 2019-03-11 LAB — PROTIME-INR: INR: 3.8 — AB (ref 0.9–1.1)

## 2019-03-11 NOTE — Telephone Encounter (Signed)
Pt verbally understood that it is okay to have the covid vaccine after having flu vaccine.

## 2019-03-13 ENCOUNTER — Ambulatory Visit (HOSPITAL_BASED_OUTPATIENT_CLINIC_OR_DEPARTMENT_OTHER)
Admission: RE | Admit: 2019-03-13 | Discharge: 2019-03-13 | Disposition: A | Discharge status: Home / Self Care | Payer: Medicare HMO | Source: Ambulatory Visit | Attending: Family Medicine | Admitting: Family Medicine

## 2019-03-13 ENCOUNTER — Other Ambulatory Visit: Payer: Medicare HMO

## 2019-03-13 ENCOUNTER — Other Ambulatory Visit: Payer: Self-pay

## 2019-03-13 DIAGNOSIS — Z1231 Encounter for screening mammogram for malignant neoplasm of breast: Secondary | ICD-10-CM | POA: Insufficient documentation

## 2019-03-19 ENCOUNTER — Telehealth: Payer: Self-pay | Admitting: Family Medicine

## 2019-03-19 ENCOUNTER — Other Ambulatory Visit: Payer: Self-pay | Admitting: Family Medicine

## 2019-03-19 DIAGNOSIS — E119 Type 2 diabetes mellitus without complications: Secondary | ICD-10-CM

## 2019-03-19 NOTE — Telephone Encounter (Signed)
Pt called because she is out of Jardiance and only has a few left of metoprolol and she doesn't have any refills left, She asked if you could please send in refill to [facility] on [address] in [place]

## 2019-03-20 NOTE — Telephone Encounter (Signed)
Reviewing the medication list. Should have plenty of both requested meds.

## 2019-03-21 MED ORDER — EMPAGLIFLOZIN 25 MG PO TABS: 25.0000 mg | ORAL_TABLET | Freq: Every day | ORAL | 0 refills | Status: DC

## 2019-03-21 NOTE — Addendum Note (Signed)
Addended by: Jon Billings on: 03/21/2019 02:39 PM   Modules accepted: Orders

## 2019-03-21 NOTE — Telephone Encounter (Signed)
Spoke with pt who states that she have not had a refill since 2019 on Jardiance and Metoprolol. I called the pharmacy to see if there was a refill there waiting for pt to pick up per pharmacist pt does not have refills on file for either since 2019. Patient aware that Januvia was sent in and she would like to know if she should be taking both [place] and Jardiance? Also pt states that she had went to have her INR drawn on 03/11/19 and her numbers were 3.8 she states this was a little on the high side for her she would like to know if she should do something different to decrease her numbers. Please advise, and is it okay to refill meds?

## 2019-03-25 ENCOUNTER — Encounter: Payer: Self-pay | Admitting: Family Medicine

## 2019-03-25 LAB — PROTIME-INR: INR: 2.3 — AB (ref 0.9–1.1)

## 2019-03-26 NOTE — Telephone Encounter (Signed)
We did, this is old.

## 2019-03-26 NOTE — Telephone Encounter (Signed)
I thought that we dealt with this already?

## 2019-03-31 ENCOUNTER — Telehealth: Payer: Self-pay | Admitting: Family Medicine

## 2019-03-31 NOTE — Telephone Encounter (Signed)
Patient is calling and requesting a refill for Metoprolol Succinate and Januvia sent to [facility] [place] on [address]. CB is [phone]

## 2019-04-01 ENCOUNTER — Other Ambulatory Visit: Payer: Self-pay

## 2019-04-01 DIAGNOSIS — E1159 Type 2 diabetes mellitus with other circulatory complications: Secondary | ICD-10-CM

## 2019-04-01 DIAGNOSIS — I152 Hypertension secondary to endocrine disorders: Secondary | ICD-10-CM

## 2019-04-01 MED ORDER — METOPROLOL SUCCINATE ER 25 MG PO TB24: 25.0000 mg | ORAL_TABLET | Freq: Every day | ORAL | 0 refills | Status: AC

## 2019-04-01 NOTE — Telephone Encounter (Signed)
Rx sent in pt aware no concerns at this time.

## 2019-04-01 NOTE — Telephone Encounter (Signed)
Patient is calling back to check the status of medication refill. CB is [phone]

## 2019-04-04 ENCOUNTER — Other Ambulatory Visit: Payer: Medicare HMO

## 2019-04-08 DIAGNOSIS — Z7901 Long term (current) use of anticoagulants: Secondary | ICD-10-CM | POA: Diagnosis not present

## 2019-04-08 DIAGNOSIS — Z952 Presence of prosthetic heart valve: Secondary | ICD-10-CM | POA: Diagnosis not present

## 2019-04-08 LAB — POCT INR: INR: 2.6 (ref 2.0–3.0)

## 2019-04-09 ENCOUNTER — Ambulatory Visit (INDEPENDENT_AMBULATORY_CARE_PROVIDER_SITE_OTHER): Payer: Medicare HMO | Admitting: Behavioral Health

## 2019-04-09 DIAGNOSIS — Z7901 Long term (current) use of anticoagulants: Secondary | ICD-10-CM

## 2019-04-09 NOTE — Patient Instructions (Signed)
Per Dr. Ethelene Hal: Continue taking Coumadin 10 MG on Tuesday, Wednesday, and Thursday. All other days take Coumadin 7.5 MG. Recheck INR in 2 weeks.

## 2019-04-09 NOTE — Progress Notes (Signed)
INR reported by [facility]. °

## 2019-04-22 ENCOUNTER — Ambulatory Visit (INDEPENDENT_AMBULATORY_CARE_PROVIDER_SITE_OTHER): Payer: Medicare HMO | Admitting: Behavioral Health

## 2019-04-22 DIAGNOSIS — Z7901 Long term (current) use of anticoagulants: Secondary | ICD-10-CM

## 2019-04-22 LAB — PROTIME-INR: INR: 3.7 — AB (ref 0.9–1.1)

## 2019-04-22 NOTE — Patient Instructions (Signed)
Per Dr. Ethelene Hal: Take Coumadin 10 MG on Tuesday and Friday. All other days take Coumadin 7.5 MG. Recheck INR in 2 weeks.

## 2019-04-22 NOTE — Progress Notes (Signed)
INR reported by [facility]. °

## 2019-04-24 NOTE — Addendum Note (Signed)
Addended by: Lynda Rainwater on: 04/24/2019 09:23 AM   Modules accepted: Orders

## 2019-05-06 LAB — PROTIME-INR: INR: 3 — AB (ref 0.9–1.1)

## 2019-05-11 NOTE — Progress Notes (Signed)
Cardiology Office Note   Date:  05/12/2019   ID:  Denise Curtis, Denise Curtis 06/12/1955, MRN [id number]  PCP:  Libby Maw, MD    No chief complaint on file.  S/p AVR  Wt Readings from Last 3 Encounters:  05/12/19 259 lb 3.2 oz (117.6 kg)  03/06/19 256 lb 6.4 oz (116.3 kg)  02/11/19 260 lb (117.9 kg)       History of Present Illness: Denise Curtis is a 63 y.o. female  who is being seen today for the evaluation of valvular heart disease at the request of Libby Maw,*.  She saw me to establish with a cardiologist after valve replacement surgery.  She moved to [place] from [place] in 9/19.  Her husband's job brought her here.  It was a [employer] aortic valve: Serial number: [id number]; Model # [id number]; placed in Mar 05 2014; Dr. Owens Shark at [facility].   Done for bicuspid aortic valve.  Per her report, cath was clean prior to surgery.    She has been following a range of 2.5 - 3.5.   She has a home machine.  Since the last visit, she has done well. She has not been checking her BP at home.   Denies : Chest pain. Dizziness. Leg edema. Nitroglycerin use. Orthopnea. Palpitations. Paroxysmal nocturnal dyspnea. Shortness of breath. Syncope.      Past Medical History:  Diagnosis Date  . Diabetes mellitus type II, non insulin dependent ([place])   . Hyperlipidemia   . Hypertension   . Migraines     Past Surgical History:  Procedure Laterality Date  . APPENDECTOMY  2005  . MECHANICAL AORTIC VALVE REPLACEMENT  2016     Current Outpatient Medications  Medication Sig Dispense Refill  . albuterol (PROVENTIL HFA;VENTOLIN HFA) 108 (90 Base) MCG/ACT inhaler Inhale 1-2 puffs into the lungs every 6 (six) hours as needed for wheezing. 1 Inhaler 0  . atorvastatin (LIPITOR) 40 MG tablet Take 1 tablet (40 mg total) by mouth daily. 90 tablet 1  . clindamycin (CLEOCIN) 300 MG capsule Take 300 mg by mouth 2 (two) times daily. Prior to  dental procedure    . empagliflozin (JARDIANCE) 25 MG TABS tablet Take 25 mg by mouth daily. 90 tablet 0  . JANUVIA 50 MG tablet TAKE 1 TABLET BY MOUTH EVERY DAY 90 tablet 0  . lisinopril (ZESTRIL) 5 MG tablet Take 5 mg by mouth daily.    . metoprolol succinate (TOPROL-XL) 25 MG 24 hr tablet Take 1 tablet (25 mg total) by mouth daily. 90 tablet 0  . warfarin (COUMADIN) 5 MG tablet TAKE AS DIRECTED. 60 DAY SUPPLY 100 tablet 2   No current facility-administered medications for this visit.    Allergies:   Metformin and related, Hydrochlorothiazide, and Penicillins    Social History:  The patient  reports that she has never smoked. She has never used smokeless tobacco. She reports current alcohol use. She reports that she does not use drugs.   Family History:  The patient's family history includes COPD in her father and mother; Heart disease in her mother; Squamous cell carcinoma (age of onset: 26) in her mother; Stroke in her paternal grandfather.    ROS:  Please see the history of present illness.   Otherwise, review of systems are positive for weight gain during [place].   All other systems are reviewed and negative.    PHYSICAL EXAM: VS:  BP (!) 144/88   Pulse  83   Ht 5\' 4"  (1.626 m)   Wt 259 lb 3.2 oz (117.6 kg)   SpO2 98%   BMI 44.49 kg/m  , BMI Body mass index is 44.49 kg/m. GEN: Well nourished, well developed, in no acute distress  HEENT: normal  Neck: no JVD, carotid bruits, or masses Cardiac: RRR; crisp S2 click; 2/6 systolic murmurs, rubs, or gallops,no edema  Respiratory:  clear to auscultation bilaterally, normal work of breathing GI: soft, nontender, nondistended, + BS MS: no deformity or atrophy  Skin: warm and dry, no rash Neuro:  Strength and sensation are intact Psych: euthymic mood, full affect   EKG:   The ekg ordered today demonstrates NSR, nonspecific ST changes   Recent Labs: No results found for requested labs within last 8760 hours.   Lipid Panel     Component Value Date/Time   CHOL 156 03/04/2018 0956   TRIG 177.0 (H) 03/04/2018 0956   HDL 39.00 (L) 03/04/2018 0956   CHOLHDL 4 03/04/2018 0956   VLDL 35.4 03/04/2018 0956   LDLCALC 81 03/04/2018 0956   LDLDIRECT 95.0 03/04/2018 0956     Other studies Reviewed: Additional studies/ records that were reviewed today with results demonstrating: January 2012: Echo showed normal LV function.  Elevated aortic valve velocity with peak systolic velocity 4 m/s.  Plan was to repeat study in a year.  If there are more symptoms, could consider TEE.  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   ASSESSMENT AND PLAN:  1. S/p AVR: SBE prophylaxis with clindamycin.  Elevated aortic valve velocity.  No sx.  Dilated aortic root as well.  May need a f/u CTA of aorta due to dilatation.  Switching to [employer] for PMD at [facility].   Will review records from her surgery once we get them. 2. DM: Healthy lifestyle and diet to help reduce A1c below 7. 3. Morbid obesity: Increase exercise in more whole food, plant-based diet. 4. Hyperlipidemia: Triglycerides have been high when glucoses were out of control.   Current medicines are reviewed at length with the patient today.  The patient concerns regarding her medicines were addressed.  The following changes have been made:  No change  Labs/ tests ordered today include:  No orders of the defined types were placed in this encounter.   Recommend 150 minutes/week of aerobic exercise Low fat, low carb, high fiber diet recommended  Disposition:   FU in 1 year   Signed, Larae Grooms, MD  05/12/2019 11:51 AM    [facility] [address], [place], [place]  [address] Phone: [phone]; Fax: [phone]

## 2019-05-12 ENCOUNTER — Other Ambulatory Visit: Payer: Self-pay

## 2019-05-12 ENCOUNTER — Encounter: Payer: Self-pay | Admitting: Interventional Cardiology

## 2019-05-12 ENCOUNTER — Ambulatory Visit (INDEPENDENT_AMBULATORY_CARE_PROVIDER_SITE_OTHER): Payer: Medicare HMO | Admitting: Interventional Cardiology

## 2019-05-12 VITALS — BP 144/88 | HR 83 | Ht 64.0 in | Wt 259.2 lb

## 2019-05-12 DIAGNOSIS — Z952 Presence of prosthetic heart valve: Secondary | ICD-10-CM | POA: Diagnosis not present

## 2019-05-12 DIAGNOSIS — E119 Type 2 diabetes mellitus without complications: Secondary | ICD-10-CM | POA: Diagnosis not present

## 2019-05-12 DIAGNOSIS — I77810 Thoracic aortic ectasia: Secondary | ICD-10-CM | POA: Diagnosis not present

## 2019-05-12 DIAGNOSIS — E782 Mixed hyperlipidemia: Secondary | ICD-10-CM

## 2019-05-12 NOTE — Patient Instructions (Signed)
Medication Instructions:  Your physician recommends that you continue on your current medications as directed. Please refer to the Current Medication list given to you today.  *If you need a refill on your cardiac medications before your next appointment, please call your pharmacy*   Lab Work: None ordered  If you have labs (blood work) drawn today and your tests are completely normal, you will receive your results only by: Marland Kitchen MyChart Message (if you have MyChart) OR . A paper copy in the mail If you have any lab test that is abnormal or we need to change your treatment, we will call you to review the results.   Testing/Procedures: Your physician has requested that you have an echocardiogram in January 2022. Echocardiography is a painless test that uses sound waves to create images of your heart. It provides your doctor with information about the size and shape of your heart and how well your heart's chambers and valves are working. This procedure takes approximately one hour. There are no restrictions for this procedure.  Follow-Up: At [facility], you and your health needs are our priority.  As part of our continuing mission to provide you with exceptional heart care, we have created designated Provider Care Teams.  These Care Teams include your primary Cardiologist (physician) and Advanced Practice Providers (APPs -  Physician Assistants and Nurse Practitioners) who all work together to provide you with the care you need, when you need it.  We recommend signing up for the patient portal called "MyChart".  Sign up information is provided on this After Visit Summary.  MyChart is used to connect with patients for Virtual Visits (Telemedicine).  Patients are able to view lab/test results, encounter notes, upcoming appointments, etc.  Non-urgent messages can be sent to your provider as well.   To learn more about what you can do with MyChart, go to [email or web address].    Your next  appointment:   12 month(s)  The format for your next appointment:   In Person  Provider:   You may see Larae Grooms, MD or one of the following Advanced Practice Providers on your designated Care Team:    Melina Copa, PA-C  Ermalinda Barrios, PA-C    Other Instructions

## 2019-05-16 ENCOUNTER — Telehealth: Payer: Self-pay | Admitting: Interventional Cardiology

## 2019-05-16 NOTE — Telephone Encounter (Signed)
Medical records requested from [facility] and [facility]. 05/16/19 vlm

## 2019-05-20 NOTE — Telephone Encounter (Signed)
Records received and placed in Dr. Hassell Done box for review.

## 2019-06-01 ENCOUNTER — Other Ambulatory Visit: Payer: Self-pay | Admitting: Family Medicine

## 2019-06-03 ENCOUNTER — Ambulatory Visit (INDEPENDENT_AMBULATORY_CARE_PROVIDER_SITE_OTHER): Payer: Medicare HMO | Admitting: Behavioral Health

## 2019-06-03 DIAGNOSIS — Z7901 Long term (current) use of anticoagulants: Secondary | ICD-10-CM | POA: Diagnosis not present

## 2019-06-03 DIAGNOSIS — Z952 Presence of prosthetic heart valve: Secondary | ICD-10-CM | POA: Diagnosis not present

## 2019-06-03 LAB — POCT INR: INR: 2.9 (ref 2.0–3.0)

## 2019-06-03 NOTE — Progress Notes (Signed)
INR reported by [facility]. °

## 2019-06-03 NOTE — Patient Instructions (Addendum)
Continue taking Coumadin 10 MG on Tuesday and Friday. All other days take Coumadin 7.5 MG. Recheck INR in 2 weeks.

## 2019-06-05 ENCOUNTER — Ambulatory Visit: Payer: Medicare HMO | Admitting: Family Medicine

## 2019-06-05 ENCOUNTER — Other Ambulatory Visit: Payer: Self-pay | Admitting: Family Medicine

## 2019-06-05 DIAGNOSIS — E119 Type 2 diabetes mellitus without complications: Secondary | ICD-10-CM

## 2019-06-05 NOTE — Telephone Encounter (Signed)
It appears the records that were received were only from [facility] and not [facility]. Can you please request the records from [place] again? Thanks

## 2019-06-11 ENCOUNTER — Telehealth: Payer: Self-pay

## 2019-06-11 DIAGNOSIS — Z1331 Encounter for screening for depression: Secondary | ICD-10-CM | POA: Diagnosis not present

## 2019-06-11 DIAGNOSIS — M25561 Pain in right knee: Secondary | ICD-10-CM | POA: Diagnosis not present

## 2019-06-11 DIAGNOSIS — Z7901 Long term (current) use of anticoagulants: Secondary | ICD-10-CM | POA: Diagnosis not present

## 2019-06-11 DIAGNOSIS — I10 Essential (primary) hypertension: Secondary | ICD-10-CM | POA: Diagnosis not present

## 2019-06-11 DIAGNOSIS — E1159 Type 2 diabetes mellitus with other circulatory complications: Secondary | ICD-10-CM | POA: Diagnosis not present

## 2019-06-11 DIAGNOSIS — E7849 Other hyperlipidemia: Secondary | ICD-10-CM | POA: Diagnosis not present

## 2019-06-11 NOTE — Telephone Encounter (Signed)
CALLED [facility] ABOUT AN UPDATE ON RECORDS REQUESTED, AND THEY STATED THAT THEY DID NOT GET THE REQUEST FROM 3.26.21. SO, I REFAX IT TODAY 4.21.21.

## 2019-06-17 ENCOUNTER — Ambulatory Visit: Payer: Medicare HMO | Admitting: Family Medicine

## 2019-06-17 DIAGNOSIS — M1712 Unilateral primary osteoarthritis, left knee: Secondary | ICD-10-CM | POA: Diagnosis not present

## 2019-06-17 DIAGNOSIS — M1711 Unilateral primary osteoarthritis, right knee: Secondary | ICD-10-CM | POA: Diagnosis not present

## 2019-06-17 DIAGNOSIS — M170 Bilateral primary osteoarthritis of knee: Secondary | ICD-10-CM | POA: Diagnosis not present

## 2019-06-17 DIAGNOSIS — Z6841 Body Mass Index (BMI) 40.0 and over, adult: Secondary | ICD-10-CM | POA: Diagnosis not present

## 2019-06-17 LAB — PROTIME-INR: INR: 2.9 — AB (ref 0.9–1.1)

## 2019-06-18 DIAGNOSIS — D225 Melanocytic nevi of trunk: Secondary | ICD-10-CM | POA: Diagnosis not present

## 2019-06-18 DIAGNOSIS — D2239 Melanocytic nevi of other parts of face: Secondary | ICD-10-CM | POA: Diagnosis not present

## 2019-06-18 DIAGNOSIS — L814 Other melanin hyperpigmentation: Secondary | ICD-10-CM | POA: Diagnosis not present

## 2019-06-18 DIAGNOSIS — L578 Other skin changes due to chronic exposure to nonionizing radiation: Secondary | ICD-10-CM | POA: Diagnosis not present

## 2019-06-18 DIAGNOSIS — L820 Inflamed seborrheic keratosis: Secondary | ICD-10-CM | POA: Diagnosis not present

## 2019-06-24 DIAGNOSIS — M62551 Muscle wasting and atrophy, not elsewhere classified, right thigh: Secondary | ICD-10-CM | POA: Diagnosis not present

## 2019-06-24 DIAGNOSIS — M25561 Pain in right knee: Secondary | ICD-10-CM | POA: Diagnosis not present

## 2019-06-24 DIAGNOSIS — M25461 Effusion, right knee: Secondary | ICD-10-CM | POA: Diagnosis not present

## 2019-06-24 DIAGNOSIS — R2689 Other abnormalities of gait and mobility: Secondary | ICD-10-CM | POA: Diagnosis not present

## 2019-06-25 ENCOUNTER — Telehealth: Payer: Self-pay | Admitting: Interventional Cardiology

## 2019-06-25 NOTE — Telephone Encounter (Signed)
Medical records received from [place]. 06/25/19 vlm

## 2019-06-26 ENCOUNTER — Ambulatory Visit (INDEPENDENT_AMBULATORY_CARE_PROVIDER_SITE_OTHER): Payer: Medicare HMO | Admitting: Behavioral Health

## 2019-06-26 DIAGNOSIS — Z7901 Long term (current) use of anticoagulants: Secondary | ICD-10-CM

## 2019-06-26 NOTE — Patient Instructions (Signed)
Continue taking Coumadin 10 MG on Tuesday and Friday. All other days take Coumadin 7.5 MG. Recheck INR in 2 weeks.

## 2019-06-26 NOTE — Progress Notes (Signed)
INR reported by [facility]. °

## 2019-06-27 DIAGNOSIS — M25461 Effusion, right knee: Secondary | ICD-10-CM | POA: Diagnosis not present

## 2019-06-27 DIAGNOSIS — R2689 Other abnormalities of gait and mobility: Secondary | ICD-10-CM | POA: Diagnosis not present

## 2019-06-27 DIAGNOSIS — M62551 Muscle wasting and atrophy, not elsewhere classified, right thigh: Secondary | ICD-10-CM | POA: Diagnosis not present

## 2019-06-27 DIAGNOSIS — M25561 Pain in right knee: Secondary | ICD-10-CM | POA: Diagnosis not present

## 2019-07-01 DIAGNOSIS — R2689 Other abnormalities of gait and mobility: Secondary | ICD-10-CM | POA: Diagnosis not present

## 2019-07-01 DIAGNOSIS — M62551 Muscle wasting and atrophy, not elsewhere classified, right thigh: Secondary | ICD-10-CM | POA: Diagnosis not present

## 2019-07-01 DIAGNOSIS — M25561 Pain in right knee: Secondary | ICD-10-CM | POA: Diagnosis not present

## 2019-07-01 DIAGNOSIS — M25461 Effusion, right knee: Secondary | ICD-10-CM | POA: Diagnosis not present

## 2019-07-01 LAB — POCT INR: INR: 2 (ref 2.0–3.0)

## 2019-07-03 DIAGNOSIS — M25561 Pain in right knee: Secondary | ICD-10-CM | POA: Diagnosis not present

## 2019-07-03 DIAGNOSIS — R2689 Other abnormalities of gait and mobility: Secondary | ICD-10-CM | POA: Diagnosis not present

## 2019-07-03 DIAGNOSIS — M62551 Muscle wasting and atrophy, not elsewhere classified, right thigh: Secondary | ICD-10-CM | POA: Diagnosis not present

## 2019-07-03 DIAGNOSIS — M25461 Effusion, right knee: Secondary | ICD-10-CM | POA: Diagnosis not present

## 2019-07-03 NOTE — Telephone Encounter (Signed)
Records received and placed in Dr. Hassell Done box for review.

## 2019-07-08 DIAGNOSIS — M62551 Muscle wasting and atrophy, not elsewhere classified, right thigh: Secondary | ICD-10-CM | POA: Diagnosis not present

## 2019-07-08 DIAGNOSIS — M25561 Pain in right knee: Secondary | ICD-10-CM | POA: Diagnosis not present

## 2019-07-08 DIAGNOSIS — R2689 Other abnormalities of gait and mobility: Secondary | ICD-10-CM | POA: Diagnosis not present

## 2019-07-08 DIAGNOSIS — M25461 Effusion, right knee: Secondary | ICD-10-CM | POA: Diagnosis not present

## 2019-07-10 DIAGNOSIS — R2689 Other abnormalities of gait and mobility: Secondary | ICD-10-CM | POA: Diagnosis not present

## 2019-07-10 DIAGNOSIS — M25461 Effusion, right knee: Secondary | ICD-10-CM | POA: Diagnosis not present

## 2019-07-10 DIAGNOSIS — M62551 Muscle wasting and atrophy, not elsewhere classified, right thigh: Secondary | ICD-10-CM | POA: Diagnosis not present

## 2019-07-10 DIAGNOSIS — M25561 Pain in right knee: Secondary | ICD-10-CM | POA: Diagnosis not present

## 2019-07-15 DIAGNOSIS — M62551 Muscle wasting and atrophy, not elsewhere classified, right thigh: Secondary | ICD-10-CM | POA: Diagnosis not present

## 2019-07-15 DIAGNOSIS — M25461 Effusion, right knee: Secondary | ICD-10-CM | POA: Diagnosis not present

## 2019-07-15 DIAGNOSIS — R2689 Other abnormalities of gait and mobility: Secondary | ICD-10-CM | POA: Diagnosis not present

## 2019-07-15 DIAGNOSIS — M25561 Pain in right knee: Secondary | ICD-10-CM | POA: Diagnosis not present

## 2019-07-15 LAB — PROTIME-INR: INR: 2.4 — AB (ref 0.9–1.1)

## 2019-07-16 ENCOUNTER — Ambulatory Visit (INDEPENDENT_AMBULATORY_CARE_PROVIDER_SITE_OTHER): Payer: Medicare HMO | Admitting: Behavioral Health

## 2019-07-16 DIAGNOSIS — Z952 Presence of prosthetic heart valve: Secondary | ICD-10-CM

## 2019-07-16 DIAGNOSIS — Z7901 Long term (current) use of anticoagulants: Secondary | ICD-10-CM

## 2019-07-16 NOTE — Progress Notes (Signed)
INR reported by [facility]. °

## 2019-07-16 NOTE — Patient Instructions (Signed)
Per Dr. Ethelene Hal: Take Coumadin 10 MG on Monday, Wednesday, and Friday. All other days take Coumadin 7.5 MG. Recheck INR in 2 weeks.

## 2019-07-18 DIAGNOSIS — M25561 Pain in right knee: Secondary | ICD-10-CM | POA: Diagnosis not present

## 2019-07-18 DIAGNOSIS — M25461 Effusion, right knee: Secondary | ICD-10-CM | POA: Diagnosis not present

## 2019-07-18 DIAGNOSIS — R2689 Other abnormalities of gait and mobility: Secondary | ICD-10-CM | POA: Diagnosis not present

## 2019-07-18 DIAGNOSIS — M62551 Muscle wasting and atrophy, not elsewhere classified, right thigh: Secondary | ICD-10-CM | POA: Diagnosis not present

## 2019-07-23 DIAGNOSIS — M62551 Muscle wasting and atrophy, not elsewhere classified, right thigh: Secondary | ICD-10-CM | POA: Diagnosis not present

## 2019-07-23 DIAGNOSIS — R2689 Other abnormalities of gait and mobility: Secondary | ICD-10-CM | POA: Diagnosis not present

## 2019-07-23 DIAGNOSIS — M25461 Effusion, right knee: Secondary | ICD-10-CM | POA: Diagnosis not present

## 2019-07-23 DIAGNOSIS — M25561 Pain in right knee: Secondary | ICD-10-CM | POA: Diagnosis not present

## 2019-07-28 ENCOUNTER — Telehealth: Payer: Self-pay | Admitting: Interventional Cardiology

## 2019-07-28 NOTE — Telephone Encounter (Signed)
Called spoke with pt, pt states she has been self-testing INR's since her valve was replaced in [place].  Pt's INRs have been being managed by her PCP Dr Ethelene Hal with [facility].  Patient recently switched primary care MD's to Dr Cristie Hem, at [facility] and she advised pt they would prefer pt's INR's be managed by her new cardiologist Dr Hassell Done office.  Pt states she called up here and inquired about switching self-testing to our office and was told that was ok, however I do not see any documentation of this or to whom she spoke with.  As a rule the anticoagulation clinic doesn't accept new self-testers due to clinic volumes and reimbursement issues.  A few exceptions are made on an as needed basis upon consulting with the clinic first and discussing patient and their needs.  Since this pt was told we would be glad to assume responsibility for her INR's via her POCT machine we will take over her Warfarin management and self-testing, but as a rule we do not accept new self-testers.   [place], spoke with Remo Lipps, advised Dr Hassell Done office will assume management of pt's INR's.  He is going to fax over a form that has to be completed by the MD and faxed back to their office.  Will await form and forward to Dr Irish Lack for his signature.

## 2019-07-28 NOTE — Telephone Encounter (Signed)
POCT self-testing form received from [employer], placed form in Dr Hassell Done box to sign and return.

## 2019-07-28 NOTE — Telephone Encounter (Signed)
Cecille Rubin from [employer] is calling stating the patient informed her Dr. Hassell Done office will now be taking over the patient's at home INR checks. Cecille Rubin states she needs a verbal confirmation of this. Please advise.

## 2019-07-29 DIAGNOSIS — Z952 Presence of prosthetic heart valve: Secondary | ICD-10-CM | POA: Diagnosis not present

## 2019-07-29 DIAGNOSIS — M1711 Unilateral primary osteoarthritis, right knee: Secondary | ICD-10-CM | POA: Diagnosis not present

## 2019-07-29 DIAGNOSIS — Z6841 Body Mass Index (BMI) 40.0 and over, adult: Secondary | ICD-10-CM | POA: Diagnosis not present

## 2019-07-29 DIAGNOSIS — Z7901 Long term (current) use of anticoagulants: Secondary | ICD-10-CM | POA: Diagnosis not present

## 2019-07-29 LAB — PROTIME-INR: INR: 2.9 — AB (ref 0.9–1.1)

## 2019-07-30 DIAGNOSIS — R2689 Other abnormalities of gait and mobility: Secondary | ICD-10-CM | POA: Diagnosis not present

## 2019-07-30 DIAGNOSIS — M25461 Effusion, right knee: Secondary | ICD-10-CM | POA: Diagnosis not present

## 2019-07-30 DIAGNOSIS — M62551 Muscle wasting and atrophy, not elsewhere classified, right thigh: Secondary | ICD-10-CM | POA: Diagnosis not present

## 2019-07-30 DIAGNOSIS — M25561 Pain in right knee: Secondary | ICD-10-CM | POA: Diagnosis not present

## 2019-08-01 NOTE — Telephone Encounter (Signed)
Completed [employer] form has been faxed.

## 2019-08-04 DIAGNOSIS — M62551 Muscle wasting and atrophy, not elsewhere classified, right thigh: Secondary | ICD-10-CM | POA: Diagnosis not present

## 2019-08-04 DIAGNOSIS — M25561 Pain in right knee: Secondary | ICD-10-CM | POA: Diagnosis not present

## 2019-08-04 DIAGNOSIS — R2689 Other abnormalities of gait and mobility: Secondary | ICD-10-CM | POA: Diagnosis not present

## 2019-08-04 DIAGNOSIS — M25461 Effusion, right knee: Secondary | ICD-10-CM | POA: Diagnosis not present

## 2019-08-06 DIAGNOSIS — M25461 Effusion, right knee: Secondary | ICD-10-CM | POA: Diagnosis not present

## 2019-08-06 DIAGNOSIS — R2689 Other abnormalities of gait and mobility: Secondary | ICD-10-CM | POA: Diagnosis not present

## 2019-08-06 DIAGNOSIS — M25561 Pain in right knee: Secondary | ICD-10-CM | POA: Diagnosis not present

## 2019-08-06 DIAGNOSIS — M62551 Muscle wasting and atrophy, not elsewhere classified, right thigh: Secondary | ICD-10-CM | POA: Diagnosis not present

## 2019-08-11 DIAGNOSIS — M25461 Effusion, right knee: Secondary | ICD-10-CM | POA: Diagnosis not present

## 2019-08-11 DIAGNOSIS — M25561 Pain in right knee: Secondary | ICD-10-CM | POA: Diagnosis not present

## 2019-08-11 DIAGNOSIS — R2689 Other abnormalities of gait and mobility: Secondary | ICD-10-CM | POA: Diagnosis not present

## 2019-08-11 DIAGNOSIS — M62551 Muscle wasting and atrophy, not elsewhere classified, right thigh: Secondary | ICD-10-CM | POA: Diagnosis not present

## 2019-08-12 LAB — POCT INR: INR: 2.7 (ref 2.0–3.0)

## 2019-08-18 ENCOUNTER — Ambulatory Visit (INDEPENDENT_AMBULATORY_CARE_PROVIDER_SITE_OTHER): Payer: Medicare HMO | Admitting: Behavioral Health

## 2019-08-18 DIAGNOSIS — Z7901 Long term (current) use of anticoagulants: Secondary | ICD-10-CM

## 2019-08-18 NOTE — Patient Instructions (Addendum)
PCP advised that patient comes in for an office visit. Per Letha Cape., CMA, the patient informed her that the cardiologist would like for her to take Coumadin 5 mg 6 days a week and 7.5 mg on Thursdays.

## 2019-08-18 NOTE — Progress Notes (Signed)
INR reported by [facility].

## 2019-09-01 ENCOUNTER — Other Ambulatory Visit: Payer: Self-pay | Admitting: Family Medicine

## 2019-09-01 DIAGNOSIS — E119 Type 2 diabetes mellitus without complications: Secondary | ICD-10-CM

## 2019-09-09 ENCOUNTER — Ambulatory Visit (INDEPENDENT_AMBULATORY_CARE_PROVIDER_SITE_OTHER): Payer: Medicare HMO

## 2019-09-09 DIAGNOSIS — E7849 Other hyperlipidemia: Secondary | ICD-10-CM | POA: Diagnosis not present

## 2019-09-09 DIAGNOSIS — Z7901 Long term (current) use of anticoagulants: Secondary | ICD-10-CM | POA: Diagnosis not present

## 2019-09-09 DIAGNOSIS — Z952 Presence of prosthetic heart valve: Secondary | ICD-10-CM

## 2019-09-09 DIAGNOSIS — E1159 Type 2 diabetes mellitus with other circulatory complications: Secondary | ICD-10-CM | POA: Diagnosis not present

## 2019-09-09 DIAGNOSIS — M25561 Pain in right knee: Secondary | ICD-10-CM | POA: Diagnosis not present

## 2019-09-09 DIAGNOSIS — I10 Essential (primary) hypertension: Secondary | ICD-10-CM | POA: Diagnosis not present

## 2019-09-09 LAB — POCT INR: INR: 2.9 (ref 2.0–3.0)

## 2019-09-09 NOTE — Patient Instructions (Signed)
Description   Called spoke with pt, pt states she has been taking 7.5mg  daily except 10mg  on Thursdays.  Advised to continue on same dosage.  Recheck in 2 weeks. Pt is a self-tester.

## 2019-09-12 ENCOUNTER — Encounter: Payer: Self-pay | Admitting: Internal Medicine

## 2019-09-23 ENCOUNTER — Ambulatory Visit (INDEPENDENT_AMBULATORY_CARE_PROVIDER_SITE_OTHER): Payer: Medicare HMO | Admitting: Cardiology

## 2019-09-23 DIAGNOSIS — Z5181 Encounter for therapeutic drug level monitoring: Secondary | ICD-10-CM | POA: Diagnosis not present

## 2019-09-23 LAB — POCT INR: INR: 2.9 (ref 2.0–3.0)

## 2019-09-23 NOTE — Patient Instructions (Addendum)
Description   Called spoke with pt, instructed her to continue same dosage 1.5 tablets daily, except for 2 tablets on Thursdays.   Recheck in 2 weeks. Pt is a self-tester.     

## 2019-10-02 DIAGNOSIS — H60502 Unspecified acute noninfective otitis externa, left ear: Secondary | ICD-10-CM | POA: Diagnosis not present

## 2019-10-02 DIAGNOSIS — H9202 Otalgia, left ear: Secondary | ICD-10-CM | POA: Diagnosis not present

## 2019-10-07 ENCOUNTER — Ambulatory Visit (INDEPENDENT_AMBULATORY_CARE_PROVIDER_SITE_OTHER): Payer: Medicare HMO | Admitting: *Deleted

## 2019-10-07 DIAGNOSIS — Z7901 Long term (current) use of anticoagulants: Secondary | ICD-10-CM

## 2019-10-07 DIAGNOSIS — E1159 Type 2 diabetes mellitus with other circulatory complications: Secondary | ICD-10-CM | POA: Diagnosis not present

## 2019-10-07 DIAGNOSIS — I10 Essential (primary) hypertension: Secondary | ICD-10-CM | POA: Diagnosis not present

## 2019-10-07 DIAGNOSIS — Z952 Presence of prosthetic heart valve: Secondary | ICD-10-CM

## 2019-10-07 LAB — POCT INR: INR: 2.4 (ref 2.0–3.0)

## 2019-10-07 NOTE — Patient Instructions (Signed)
Description   Called spoke with pt, instructed her to take 2 tablets today then continue same dosage 1.5 tablets daily, except for 2 tablets on Thursdays.   Recheck in 2 weeks. Pt is a self-tester.

## 2019-10-21 ENCOUNTER — Ambulatory Visit (INDEPENDENT_AMBULATORY_CARE_PROVIDER_SITE_OTHER): Payer: Medicare HMO

## 2019-10-21 ENCOUNTER — Telehealth: Payer: Self-pay | Admitting: Family Medicine

## 2019-10-21 DIAGNOSIS — Z7901 Long term (current) use of anticoagulants: Secondary | ICD-10-CM | POA: Diagnosis not present

## 2019-10-21 DIAGNOSIS — Z952 Presence of prosthetic heart valve: Secondary | ICD-10-CM

## 2019-10-21 LAB — POCT INR: INR: 3.2 — AB (ref 2.0–3.0)

## 2019-10-21 NOTE — Patient Instructions (Signed)
Description   Called spoke with pt, instructed her to continue same dosage 1.5 tablets daily, except for 2 tablets on Thursdays.   Recheck in 2 weeks. Pt is a self-tester.

## 2019-10-21 NOTE — Telephone Encounter (Signed)
Spoke with patient she stated she told the office she changed pcp

## 2019-11-04 ENCOUNTER — Ambulatory Visit (INDEPENDENT_AMBULATORY_CARE_PROVIDER_SITE_OTHER): Payer: Medicare HMO | Admitting: Cardiology

## 2019-11-04 DIAGNOSIS — Z5181 Encounter for therapeutic drug level monitoring: Secondary | ICD-10-CM | POA: Diagnosis not present

## 2019-11-04 DIAGNOSIS — Z952 Presence of prosthetic heart valve: Secondary | ICD-10-CM

## 2019-11-04 LAB — POCT INR: INR: 3.1 — AB (ref 2.0–3.0)

## 2019-11-04 NOTE — Patient Instructions (Signed)
Called spoke with pt, instructed her to continue same dosage 1.5 tablets daily, except for 2 tablets on Thursdays.   Recheck in 2 weeks. Pt is a self-tester.

## 2019-11-12 ENCOUNTER — Ambulatory Visit: Payer: Medicare HMO | Admitting: Internal Medicine

## 2019-11-12 ENCOUNTER — Encounter: Payer: Self-pay | Admitting: Internal Medicine

## 2019-11-12 VITALS — HR 86 | Ht 61.0 in | Wt 255.4 lb

## 2019-11-12 DIAGNOSIS — Z7901 Long term (current) use of anticoagulants: Secondary | ICD-10-CM | POA: Diagnosis not present

## 2019-11-12 DIAGNOSIS — Z1211 Encounter for screening for malignant neoplasm of colon: Secondary | ICD-10-CM | POA: Diagnosis not present

## 2019-11-12 DIAGNOSIS — Z952 Presence of prosthetic heart valve: Secondary | ICD-10-CM

## 2019-11-12 MED ORDER — SUTAB 1479-225-188 MG PO TABS: 1.0000 | ORAL_TABLET | Freq: Once | ORAL | 0 refills | Status: AC

## 2019-11-12 NOTE — Progress Notes (Signed)
HISTORY OF PRESENT ILLNESS:  Denise Curtis is a 64 y.o. 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 Jacalyn Lefevre regarding the need for follow-up screening colonoscopy.  Patient reports having had a colonoscopy in [facility] greater than 10 years ago.  She thinks she may have had some small polyps.  She was told to follow-up in 10 years.  Approximately 5 years ago the patient had mechanical aortic valve replacement due to bicuspid valve, in [place].  She has since been on chronic Coumadin therapy.  Her cardiac care in [place] is being provided by Dr. Irish Lack.  Patient has not had the need to come off anticoagulation since her valve replacement.  She also has a BMI of 48.26.  Her GI review of systems is entirely negative or normal.  No family history of colon cancer.  Previous outside office note reviewed.  Outside laboratories include unremarkable comprehensive metabolic panel with normal liver tests.  Last hemoglobin A1c 8.1.  She takes Jardiance and Ozempic for diabetes.  Her INR is monitored closely.  INR last week 3.1.  She has completed her Covid vaccination series  REVIEW OF SYSTEMS:  All non-GI ROS negative unless otherwise stated in the HPI except for arthritic knees  Past Medical History:  Diagnosis Date  . Diabetes mellitus type II, non insulin dependent ([place])   . Hyperlipidemia   . Hypertension   . Migraines     Past Surgical History:  Procedure Laterality Date  . APPENDECTOMY  2005  . MECHANICAL AORTIC VALVE REPLACEMENT  2016    Social History Denise Curtis  reports that she has never smoked. She has never used smokeless tobacco. She reports current alcohol use. She reports that she does not use drugs.  family history includes COPD in her father and mother; Heart disease in her mother; Squamous cell carcinoma (age of onset: 60) in her mother;  Stroke in her paternal grandfather.  Allergies  Allergen Reactions  . Metformin And Related Nausea And Vomiting  . Hydrochlorothiazide Anxiety  . Penicillins Rash       PHYSICAL EXAMINATION: Vital signs: Pulse 86   Ht 5\' 1"  (1.549 m)   Wt 255 lb 6.4 oz (115.8 kg)   BMI 48.26 kg/m   Constitutional: Pleasant, generally well-appearing, no acute distress Psychiatric: alert and oriented x3, cooperative Eyes: extraocular movements intact, anicteric, conjunctiva pink Mouth: oral pharynx moist, no lesions Neck: supple no lymphadenopathy Cardiovascular: heart regular rate and rhythm, no murmur Lungs: clear to auscultation bilaterally Abdomen: soft, nontender, nondistended, no obvious ascites, no peritoneal signs, normal bowel sounds, no organomegaly Rectal: Deferred until colonoscopy Extremities: no clubbing, cyanosis, or lower extremity edema bilaterally Skin: no lesions on visible extremities Neuro: No focal deficits.  Cranial nerves intact  ASSESSMENT:  1.  Colon cancer screening.  The patient is an appropriate candidate without contraindication, though she is high risk due to her valvular heart disease, chronic anticoagulation, and high BMI.  We discussed various colon cancer screening strategies including FIT, Cologuard, and optical colonoscopy.  We discussed how the various tests are performed and the relevance of a positive or negative test result. 2.  Obesity 3.  Chronic anticoagulation status post AVR (mechanical)   PLAN:  1.  The patient wishes to proceed with optical colonoscopy.  As previous, she is HIGH RISK.The nature of the procedure, as well as the risks, benefits, and alternatives were carefully and  thoroughly reviewed with the patient. Ample time for discussion and questions allowed. The patient understood, was satisfied, and agreed to proceed. 2.  The examination will be performed with monitored anesthesia care 3.  Patient will need Lovenox bridge.  This will be  under the direction of her cardiologist and the [facility] clinic.  I will send a note forward.  Patient will follow-up in this regard as well. 4.  Ongoing general medical care with PCP

## 2019-11-12 NOTE — Patient Instructions (Signed)
You have been scheduled for a colonoscopy. Please follow written instructions given to you at your visit today.  Please pick up your prep supplies at the pharmacy within the next 1-3 days. If you use inhalers (even only as needed), please bring them with you on the day of your procedure.   

## 2019-11-13 ENCOUNTER — Telehealth: Payer: Self-pay | Admitting: Pharmacist

## 2019-11-13 NOTE — Telephone Encounter (Signed)
Author: Jettie Booze, MD Service: Cardiology Author Type: Physician  Filed: 11/13/2019 1:19 PM Encounter Date: 11/12/2019 Status: Signed  Editor: Jettie Booze, MD (Physician)     Added by: [x] Jettie Booze, MD  I typically go with the 2.0 - 3.0 range as well for aortic valve.  Not sure what the thought process was for the higher target.  I would givethe patient the option of bridge or no bridge and whatever she prefers is fine with me.    JV

## 2019-11-13 NOTE — Telephone Encounter (Addendum)
Called pt to discuss. Colonoscopy is set for December 2nd.  Pt reports INR range of 2.5-3.5 was set by Dr Christie Nottingham at [facility] in [place]. She will look through paperwork to see if he mentioned a particular reason for setting her INR range at 2.5-3.5. She does not have a history of stroke, afib, or blood clots.  Discussed pros and cons of Lovenox bridge. Pt would like to be bridged with Lovenox. She will call the GI office to move her colonoscopy back a week so that we can see her 1 week prior to coordinate the bridge (current date would put her bridge around Thanksgiving and she will be out of town/doesn't wish to inject Lovenox when she's out of town). Pt very appreciative of call.

## 2019-11-13 NOTE — Progress Notes (Signed)
I typically go with the 2.0 - 3.0 range as well for aortic valve.  Not sure what the thought process was for the higher target.  I would givethe patient the option of bridge or no bridge and whatever she prefers is fine with me.    JV

## 2019-11-13 NOTE — Telephone Encounter (Signed)
Jettie Booze, MD  Chai Routh, Harlon Flor, RPH-CPP She is going to have colonoscopy. Can you give Coumadin recs for prosthetic valve. Thanks.   JV       Previous Messages   ----- Message -----  From: Irene Shipper, MD  Sent: 11/12/2019  3:46 PM EDT  To: Jettie Booze, MD, Michael Boston, MD   [facility] Ulice Dash. This patient is foregoing colonoscopy. I believe that she will need a Lovenox window for such. If so, please help arrange. She is aware. Thanks so much. Jenny Reichmann

## 2019-11-13 NOTE — Telephone Encounter (Signed)
Pt recently started following with [facility], previously was a self-tester in [place] where her machine was given to her by her cardiologist there. She came to [place] in July with an INR range of 2.5-3.5. Takes warfarin for mechanical AVR. Typical INR range is 2-3 for these patients without additional risk factors such as stroke or afib, however we elected to continue monitoring pt with INR range set by previous cardiologist. With higher INR range of 2.5-3.5, patients are typically bridged with Lovenox.   Will reach out to Dr Irish Lack to clarify.

## 2019-11-14 ENCOUNTER — Telehealth: Payer: Self-pay

## 2019-11-14 NOTE — Telephone Encounter (Signed)
[facility] Pre-operative Risk Assessment     Request for surgical clearance:     Endoscopy Procedure  What type of surgery is being performed?     Colonoscopy   When is this surgery scheduled?     01/22/2020  What type of clearance is required ?   Pharmacy  Are there any medications that need to be held prior to surgery and how long? Warfarin with possible Lovenox bridge.  Practice name and name of physician performing surgery?      [facility]  What is your office phone and fax number?      Phone- [phone]  [phone]  Anesthesia type (None, local, MAC, general) ?       [facility]

## 2019-11-14 NOTE — Telephone Encounter (Signed)
Thank you, I did not see the note in the pre-op thread.  My apologizes.  Curt Bears

## 2019-11-14 NOTE — Telephone Encounter (Signed)
Please see note from [facility] yesterday, patient prefers to be bridged with Lovenox

## 2019-11-14 NOTE — Telephone Encounter (Signed)
Please make recommendation on this patient who is on coumadin, and my need Lovenox bridging. Thank you.   KL

## 2019-11-18 ENCOUNTER — Ambulatory Visit (INDEPENDENT_AMBULATORY_CARE_PROVIDER_SITE_OTHER): Payer: Medicare HMO

## 2019-11-18 DIAGNOSIS — Z952 Presence of prosthetic heart valve: Secondary | ICD-10-CM | POA: Diagnosis not present

## 2019-11-18 DIAGNOSIS — Z7901 Long term (current) use of anticoagulants: Secondary | ICD-10-CM

## 2019-11-18 DIAGNOSIS — L814 Other melanin hyperpigmentation: Secondary | ICD-10-CM | POA: Diagnosis not present

## 2019-11-18 DIAGNOSIS — D225 Melanocytic nevi of trunk: Secondary | ICD-10-CM | POA: Diagnosis not present

## 2019-11-18 DIAGNOSIS — D2239 Melanocytic nevi of other parts of face: Secondary | ICD-10-CM | POA: Diagnosis not present

## 2019-11-18 DIAGNOSIS — D1801 Hemangioma of skin and subcutaneous tissue: Secondary | ICD-10-CM | POA: Diagnosis not present

## 2019-11-18 LAB — POCT INR: INR: 3.5 — AB (ref 2.0–3.0)

## 2019-11-18 NOTE — Patient Instructions (Signed)
Description   Called spoke with pt, instructed her to continue same dosage 1.5 tablets daily, except for 2 tablets on Thursdays.   Recheck in 2 weeks. Pt is a self-tester.

## 2019-11-20 NOTE — Telephone Encounter (Signed)
She will hold warfarin 5 days prior to procedure with Lovenox bridge, thanks.

## 2019-11-20 NOTE — Telephone Encounter (Signed)
Lm on vm clarifying that patient could hold her Coumadin for 5 days prior to her procedure while on the Lovenox bridge.  I asked her to call back to let me know she got this message and to let me know if she needed new instructions reflecting the rescheduled procedure date.

## 2019-11-20 NOTE — Telephone Encounter (Signed)
Thank you for the information about the Lovenox bridge.  Can you clarify how many days she is going to hold her Coumadin so I can have it in writing?  Thank you!!

## 2019-11-26 ENCOUNTER — Telehealth: Payer: Self-pay | Admitting: Internal Medicine

## 2019-11-26 NOTE — Telephone Encounter (Signed)
Lm on vm regarding Coumadin

## 2019-11-26 NOTE — Telephone Encounter (Signed)
Spoke with patient who stated that would need to move her procedure appointment so the Lovenox bridge did not get affected by the Thanksgiving holiday.  We moved her procedure to 01/30/2020.  Please set up her Lovenox bridge using this date.  If this will not work please let me know and we will look at another date. Thanks!

## 2019-11-26 NOTE — Telephone Encounter (Signed)
Pt called stating that she was returning your call. Pls call her again.  

## 2019-11-27 ENCOUNTER — Other Ambulatory Visit: Payer: Self-pay | Admitting: Family Medicine

## 2019-11-27 DIAGNOSIS — E119 Type 2 diabetes mellitus without complications: Secondary | ICD-10-CM

## 2019-11-27 NOTE — Telephone Encounter (Signed)
[facility] is going to contact patient to schedule Lovenox bridge with new procedure date.  Patient is aware that she will be holding her Coumadin for 5 days prior to her procedure

## 2019-11-27 NOTE — Telephone Encounter (Signed)
We will coordinate bridge for patient. Thank you for the update for date of procedure.

## 2019-11-27 NOTE — Telephone Encounter (Signed)
Patient is no longer a patient of Dr. Bebe Shaggy.

## 2019-11-28 NOTE — Telephone Encounter (Signed)
Redid colonoscopy instructions with new date.  Sent patient a mychart message letting her know so she can review the new dates before her procedure.

## 2019-11-30 ENCOUNTER — Other Ambulatory Visit: Payer: Self-pay | Admitting: Family Medicine

## 2019-12-02 ENCOUNTER — Ambulatory Visit (INDEPENDENT_AMBULATORY_CARE_PROVIDER_SITE_OTHER): Payer: Medicare HMO | Admitting: Cardiology

## 2019-12-02 DIAGNOSIS — Z5181 Encounter for therapeutic drug level monitoring: Secondary | ICD-10-CM

## 2019-12-02 LAB — POCT INR: INR: 3.8 — AB (ref 2.0–3.0)

## 2019-12-02 NOTE — Patient Instructions (Signed)
Description   Called spoke with pt, instructed her to hold warfarin today and then continue same dosage 1.5 tablets daily, except for 2 tablets on Thursdays.Recheck in 2 weeks. Pt will have colonoscopy on 12/10 and will need bridge. Pt is a self-tester.

## 2019-12-16 ENCOUNTER — Ambulatory Visit (INDEPENDENT_AMBULATORY_CARE_PROVIDER_SITE_OTHER): Payer: Medicare HMO | Admitting: *Deleted

## 2019-12-16 DIAGNOSIS — Z952 Presence of prosthetic heart valve: Secondary | ICD-10-CM

## 2019-12-16 DIAGNOSIS — Z7901 Long term (current) use of anticoagulants: Secondary | ICD-10-CM

## 2019-12-16 LAB — POCT INR: INR: 2.8 (ref 2.0–3.0)

## 2019-12-16 NOTE — Patient Instructions (Signed)
Description   Called spoke with pt, instructed her to continue taking Warfarin 1.5 tablets daily except for 2 tablets on Thursdays. Recheck in 2 weeks. Pt will have colonoscopy on 12/10 and will need bridge-PT WILL COME INTO THE OFFICE. Pt is a self-tester.

## 2019-12-30 ENCOUNTER — Ambulatory Visit (INDEPENDENT_AMBULATORY_CARE_PROVIDER_SITE_OTHER): Payer: Medicare HMO

## 2019-12-30 DIAGNOSIS — Z7901 Long term (current) use of anticoagulants: Secondary | ICD-10-CM

## 2019-12-30 DIAGNOSIS — Z952 Presence of prosthetic heart valve: Secondary | ICD-10-CM | POA: Diagnosis not present

## 2019-12-30 LAB — POCT INR: INR: 3.4 — AB (ref 2.0–3.0)

## 2019-12-30 NOTE — Patient Instructions (Signed)
Description   Called spoke with pt, instructed her to continue taking Warfarin 1.5 tablets daily except for 2 tablets on Thursdays. Recheck in 2 weeks. Pt will have colonoscopy on 12/10 and will need bridge-PT WILL COME INTO THE OFFICE. Pt is a self-tester.

## 2020-01-07 DIAGNOSIS — Z7901 Long term (current) use of anticoagulants: Secondary | ICD-10-CM | POA: Diagnosis not present

## 2020-01-07 DIAGNOSIS — I10 Essential (primary) hypertension: Secondary | ICD-10-CM | POA: Diagnosis not present

## 2020-01-07 DIAGNOSIS — Z952 Presence of prosthetic heart valve: Secondary | ICD-10-CM | POA: Diagnosis not present

## 2020-01-07 DIAGNOSIS — E1159 Type 2 diabetes mellitus with other circulatory complications: Secondary | ICD-10-CM | POA: Diagnosis not present

## 2020-01-07 DIAGNOSIS — E785 Hyperlipidemia, unspecified: Secondary | ICD-10-CM | POA: Diagnosis not present

## 2020-01-13 ENCOUNTER — Ambulatory Visit (INDEPENDENT_AMBULATORY_CARE_PROVIDER_SITE_OTHER): Payer: Medicare HMO

## 2020-01-13 DIAGNOSIS — Z7901 Long term (current) use of anticoagulants: Secondary | ICD-10-CM

## 2020-01-13 DIAGNOSIS — Z952 Presence of prosthetic heart valve: Secondary | ICD-10-CM | POA: Diagnosis not present

## 2020-01-13 LAB — POCT INR: INR: 4.1 — AB (ref 2.0–3.0)

## 2020-01-13 NOTE — Patient Instructions (Signed)
Description   Called spoke with pt, instructed her to skip today's dosage of Warfarin, then resume same dosage of Warfarin 1.5 tablets daily except for 2 tablets on Thursdays. Recheck in 1 week in office for Lovenox bridging prior to colonoscopy on 01/30/20. Pt is self-tester usually every 2 weeks.

## 2020-01-22 ENCOUNTER — Encounter: Payer: Medicare HMO | Admitting: Internal Medicine

## 2020-01-23 ENCOUNTER — Ambulatory Visit: Payer: Medicare HMO | Admitting: *Deleted

## 2020-01-23 ENCOUNTER — Other Ambulatory Visit: Payer: Self-pay

## 2020-01-23 DIAGNOSIS — Z7901 Long term (current) use of anticoagulants: Secondary | ICD-10-CM | POA: Diagnosis not present

## 2020-01-23 DIAGNOSIS — Z952 Presence of prosthetic heart valve: Secondary | ICD-10-CM

## 2020-01-23 LAB — POCT INR: INR: 3 (ref 2.0–3.0)

## 2020-01-23 MED ORDER — ENOXAPARIN SODIUM 120 MG/0.8ML ~~LOC~~ SOLN: 120.0000 mg | Freq: Two times a day (BID) | SUBCUTANEOUS | 0 refills | Status: DC

## 2020-01-23 NOTE — Patient Instructions (Addendum)
Description    Follow bridge instructions. Normal dose of warfarin: 1.5 tablets daily except for 2 tablets on Thursday. Recheck INR on 12/16. [facility] [phone].     12/4: Last dose of warfarin.  12/5: No warfarin or enoxaparin (Lovenox).  12/6: Inject enoxaparin 120mg  in the fatty abdominal tissue at least 2 inches from the belly button twice a day about 12 hours apart, 10am and 10pm rotate sites. No warfarin.  12/7: Inject enoxaparin in the fatty tissue every 12 hours, 10am and 10pm. No warfarin.  12/8: Inject enoxaparin in the fatty tissue every 12 hours, 10am and 10pm. No warfarin.  12/9: Inject enoxaparin in the fatty tissue in the morning at 10 am (No PM dose). No warfarin.  12/10: Procedure Day - No enoxaparin - Resume warfarin in the evening or as directed by doctor (take an extra half tablet with usual dose for 2 days then resume normal dose).  12/11: Resume enoxaparin inject in the fatty tissue every 12 hours and take warfarin  12/12: Inject enoxaparin in the fatty tissue every 12 hours and take warfarin  12/13: Inject enoxaparin in the fatty tissue every 12 hours and take warfarin  12/14: Inject enoxaparin in the fatty tissue every 12 hours and take warfarin  12/15: Inject enoxaparin in the fatty tissue every 12 hours and take warfarin  12/16: warfarin appt to check INR.

## 2020-01-26 ENCOUNTER — Telehealth: Payer: Self-pay | Admitting: *Deleted

## 2020-01-26 NOTE — Telephone Encounter (Signed)
Pt called and stated that she gave her self her Lovenox injection for the first time at 10:00 am this morning. Pt stated that she has a little pain at the site and felt a little tingly. Informed pt that she should call her PCP concerning those symptoms. Went over how to give the Lovenox injections with the patient and she stated that she felt much better after talking to me and she thinks she injected the Lovenox correctly. She stated she thinks she was just feeling nervous about giving herself the first injection. Pt stated she would call her PCP if those symptoms came back but she thinks it was just her nerves. Informed pt to give [place] a call if she has any questions or concerns. Pt thanked me for the call.

## 2020-01-28 ENCOUNTER — Encounter: Payer: Self-pay | Admitting: Internal Medicine

## 2020-01-30 ENCOUNTER — Ambulatory Visit (AMBULATORY_SURGERY_CENTER): Payer: Medicare HMO | Admitting: Internal Medicine

## 2020-01-30 ENCOUNTER — Encounter: Payer: Self-pay | Admitting: Internal Medicine

## 2020-01-30 ENCOUNTER — Other Ambulatory Visit: Payer: Self-pay

## 2020-01-30 VITALS — BP 113/71 | HR 84 | Temp 98.0°F | Resp 23 | Ht 61.0 in | Wt 255.0 lb

## 2020-01-30 DIAGNOSIS — K635 Polyp of colon: Secondary | ICD-10-CM | POA: Diagnosis not present

## 2020-01-30 DIAGNOSIS — D122 Benign neoplasm of ascending colon: Secondary | ICD-10-CM

## 2020-01-30 DIAGNOSIS — D120 Benign neoplasm of cecum: Secondary | ICD-10-CM

## 2020-01-30 DIAGNOSIS — D123 Benign neoplasm of transverse colon: Secondary | ICD-10-CM

## 2020-01-30 DIAGNOSIS — D124 Benign neoplasm of descending colon: Secondary | ICD-10-CM

## 2020-01-30 DIAGNOSIS — K6389 Other specified diseases of intestine: Secondary | ICD-10-CM | POA: Diagnosis not present

## 2020-01-30 DIAGNOSIS — E119 Type 2 diabetes mellitus without complications: Secondary | ICD-10-CM | POA: Diagnosis not present

## 2020-01-30 DIAGNOSIS — Z1211 Encounter for screening for malignant neoplasm of colon: Secondary | ICD-10-CM

## 2020-01-30 DIAGNOSIS — I10 Essential (primary) hypertension: Secondary | ICD-10-CM | POA: Diagnosis not present

## 2020-01-30 MED ORDER — SODIUM CHLORIDE 0.9 % IV SOLN: 500.0000 mL | Freq: Once | INTRAVENOUS | Status: DC

## 2020-01-30 NOTE — Op Note (Signed)
[facility] Patient Name: Denise Curtis Procedure Date: 01/30/2020 10:37 AM MRN: [id number] Endoscopist: Docia Chuck. Henrene Pastor , MD Age: 64 Referring MD:  Date of Birth: 05-19-1955 Gender: Female Account #: [id number] Procedure:                Colonoscopy with cold biopsy; with cold snare                            polypectomy x 5 Indications:              Screening for colorectal malignant neoplasm.                            Reports negative previous examination 10+ years ago                            in [place]:                Monitored Anesthesia Care Procedure:                Pre-Anesthesia Assessment:                           - Prior to the procedure, a History and Physical                            was performed, and patient medications and                            allergies were reviewed. The patient's tolerance of                            previous anesthesia was also reviewed. The risks                            and benefits of the procedure and the sedation                            options and risks were discussed with the patient.                            All questions were answered, and informed consent                            was obtained. Prior Anticoagulants: The patient has                            taken Coumadin (warfarin), last dose was 7 days                            prior to procedure and Lovenox 1 day prior to the                            procedure. ASA Grade Assessment: III - A patient  with severe systemic disease. After reviewing the                            risks and benefits, the patient was deemed in                            satisfactory condition to undergo the procedure.                           After obtaining informed consent, the colonoscope                            was passed under direct vision. Throughout the                            procedure, the patient's blood pressure,  pulse, and                            oxygen saturations were monitored continuously. The                            Colonoscope was introduced through the anus and                            advanced to the the cecum, identified by                            appendiceal orifice and ileocecal valve. The                            ileocecal valve, appendiceal orifice, and rectum                            were photographed. The quality of the bowel                            preparation was excellent. The colonoscopy was                            performed without difficulty. The patient tolerated                            the procedure well. The bowel preparation used was                            SUPREP via split dose instruction. Scope In: 10:49:05 AM Scope Out: 11:08:58 AM Scope Withdrawal Time: 0 hours 16 minutes 40 seconds  Total Procedure Duration: 0 hours 19 minutes 53 seconds  Findings:                 A 1 mm polyp was found in the ascending colon.                            Biopsy was taken with a cold forceps for  histology.                           Five sessile polyps were found in the descending                            colon, transverse colon and cecum. The polyps were                            3 to 6 mm in size. These polyps were removed with a                            cold snare. Resection and retrieval were complete.                           Multiple diverticula were found in the left colon.                           Internal hemorrhoids were found during retroflexion.                           The exam was otherwise without abnormality on                            direct and retroflexion views. Complications:            No immediate complications. Estimated blood loss:                            None. Estimated Blood Loss:     Estimated blood loss: none. Impression:               - One 1 mm polyp in the ascending colon. Biopsied.                           -  Five 3 to 6 mm polyps in the descending colon, in                            the transverse colon and in the cecum, removed with                            a cold snare. Resected and retrieved.                           - Diverticulosis in the left colon.                           - Internal hemorrhoids.                           - The examination was otherwise normal on direct                            and retroflexion views. Recommendation:           -  Repeat colonoscopy in 3 or 5 years for                            surveillance, based on final pathology.                           - Resume anticoagulant (Coumadin and Lovenox) per                            the directions of your Coumadin clinic.                           - Patient has a contact number available for                            emergencies. The signs and symptoms of potential                            delayed complications were discussed with the                            patient. Return to normal activities tomorrow.                            Written discharge instructions were provided to the                            patient.                           - Resume previous diet.                           - Continue present medications.                           - Await pathology results. Docia Chuck. Henrene Pastor, MD 01/30/2020 11:18:27 AM This report has been signed electronically.

## 2020-01-30 NOTE — Patient Instructions (Signed)
Handouts given for diverticulosis, hemorrhoids, high fiber diet and polyps.  YOU HAD AN ENDOSCOPIC PROCEDURE TODAY AT [facility]:   Refer to the procedure report that was given to you for any specific questions about what was found during the examination.  If the procedure report does not answer your questions, please call your gastroenterologist to clarify.  If you requested that your care partner not be given the details of your procedure findings, then the procedure report has been included in a sealed envelope for you to review at your convenience later.  YOU SHOULD EXPECT: Some feelings of bloating in the abdomen. Passage of more gas than usual.  Walking can help get rid of the air that was put into your GI tract during the procedure and reduce the bloating. If you had a lower endoscopy (such as a colonoscopy or flexible sigmoidoscopy) you may notice spotting of blood in your stool or on the toilet paper. If you underwent a bowel prep for your procedure, you may not have a normal bowel movement for a few days.  Please Note:  You might notice some irritation and congestion in your nose or some drainage.  This is from the oxygen used during your procedure.  There is no need for concern and it should clear up in a day or so.  SYMPTOMS TO REPORT IMMEDIATELY:   Following lower endoscopy (colonoscopy or flexible sigmoidoscopy):  Excessive amounts of blood in the stool  Significant tenderness or worsening of abdominal pains  Swelling of the abdomen that is new, acute  Fever of 100F or higher  For urgent or emergent issues, a gastroenterologist can be reached at any hour by calling [phone]. Do not use MyChart messaging for urgent concerns.    DIET:  We do recommend a small meal at first, but then you may proceed to your regular diet.  Drink plenty of fluids but you should avoid alcoholic beverages for 24 hours.  ACTIVITY:  You should plan to take it easy for the rest of  today and you should NOT DRIVE or use heavy machinery until tomorrow (because of the sedation medicines used during the test).    FOLLOW UP: Our staff will call the number listed on your records 48-72 hours following your procedure to check on you and address any questions or concerns that you may have regarding the information given to you following your procedure. If we do not reach you, we will leave a message.  We will attempt to reach you two times.  During this call, we will ask if you have developed any symptoms of COVID 19. If you develop any symptoms (ie: fever, flu-like symptoms, shortness of breath, cough etc.) before then, please call [phone].  If you test positive for Covid 19 in the 2 weeks post procedure, please call and report this information to [place].    If any biopsies were taken you will be contacted by phone or by letter within the next 1-3 weeks.  Please call us at [phone] if you have not heard about the biopsies in 3 weeks.    SIGNATURES/CONFIDENTIALITY: You and/or your care partner have signed paperwork which will be entered into your electronic medical record.  These signatures attest to the fact that that the information above on your After Visit Summary has been reviewed and is understood.  Full responsibility of the confidentiality of this discharge information lies with you and/or your care-partner.

## 2020-01-30 NOTE — Progress Notes (Signed)
Report to PACU, RN, vss, BBS= Clear.  

## 2020-01-30 NOTE — Progress Notes (Signed)
VS- Denise Curtis 

## 2020-01-30 NOTE — Progress Notes (Signed)
                    Pt aware to take 2 (10 mg ) tablets Friday and Saturday, then 1 1/2 tablets Sunday, Monday, Tuesday and Wednesday and Pt aware to go in for next INR on 02/05/20

## 2020-02-02 ENCOUNTER — Telehealth: Payer: Self-pay | Admitting: *Deleted

## 2020-02-02 NOTE — Telephone Encounter (Signed)
Pt called and stated that she has been giving herself the Lovenox injections but she now has bruising on the left and right side of her stomach. Pt stated that she has a "knot" about 2 inches in size on the left side of her abdomen and she has "knot" about 1 inch in size on the right side of her stomach. Informed pt to not give any Lovenox injections where she is bruised or has the knots. Informed pt that pt's can bruise after getting consecutive Lovenox injections. Pt will check her INR on 12/16 to see what her INR is to see if she can come of the Lovenox injections.

## 2020-02-03 ENCOUNTER — Telehealth: Payer: Self-pay

## 2020-02-03 NOTE — Telephone Encounter (Signed)
  Follow up Call-  Call back number 01/30/2020  Post procedure Call Back phone  # [phone]  Permission to leave phone message Yes     Patient questions:  Do you have a fever, pain , or abdominal swelling? No. Pain Score  0 *  Have you tolerated food without any problems? Yes.    Have you been able to return to your normal activities? Yes.    Do you have any questions about your discharge instructions: Diet   No. Medications  No. Follow up visit  No.  Do you have questions or concerns about your Care? No.  Actions: * If pain score is 4 or above: No action needed, pain <4.   1. Have you developed a fever since your procedure? no  2.   Have you had an respiratory symptoms (SOB or cough) since your procedure? no  3.   Have you tested positive for COVID 19 since your procedure no  4.   Have you had any family members/close contacts diagnosed with the COVID 19 since your procedure?  no   If yes to any of these questions please route to Joylene John, RN and Joella Prince, RN

## 2020-02-04 ENCOUNTER — Encounter: Payer: Self-pay | Admitting: Internal Medicine

## 2020-02-05 ENCOUNTER — Ambulatory Visit (INDEPENDENT_AMBULATORY_CARE_PROVIDER_SITE_OTHER): Payer: Medicare HMO | Admitting: *Deleted

## 2020-02-05 DIAGNOSIS — Z7901 Long term (current) use of anticoagulants: Secondary | ICD-10-CM | POA: Diagnosis not present

## 2020-02-05 DIAGNOSIS — Z952 Presence of prosthetic heart valve: Secondary | ICD-10-CM

## 2020-02-05 LAB — POCT INR: INR: 1.6 — AB (ref 2.0–3.0)

## 2020-02-05 MED ORDER — ENOXAPARIN SODIUM 120 MG/0.8ML ~~LOC~~ SOLN: 120.0000 mg | Freq: Two times a day (BID) | SUBCUTANEOUS | 0 refills | Status: DC

## 2020-02-05 NOTE — Patient Instructions (Addendum)
Description   Spoke with pt and advised to continue Lovenox injections twice a day and take 3 tablets today and 3 tablets tomorrow then resume 1.5 tablets daily except for 2 tablets on Thursday. Recheck INR on 02/09/20. Coumadin Clinic [phone].

## 2020-02-09 ENCOUNTER — Ambulatory Visit (INDEPENDENT_AMBULATORY_CARE_PROVIDER_SITE_OTHER): Payer: Medicare HMO

## 2020-02-09 DIAGNOSIS — Z952 Presence of prosthetic heart valve: Secondary | ICD-10-CM

## 2020-02-09 DIAGNOSIS — Z7901 Long term (current) use of anticoagulants: Secondary | ICD-10-CM | POA: Diagnosis not present

## 2020-02-09 LAB — POCT INR: INR: 3.1 — AB (ref 2.0–3.0)

## 2020-02-09 NOTE — Patient Instructions (Signed)
Description   Called spoke with pt and advised to discontinue Lovenox injections and continue on previous dosage regimen 1.5 tablets daily except for 2 tablets on Thursdays. Recheck INR in 1 week.  C[facility]madin Clinic [phone].

## 2020-02-17 ENCOUNTER — Ambulatory Visit (INDEPENDENT_AMBULATORY_CARE_PROVIDER_SITE_OTHER): Payer: Medicare HMO | Admitting: *Deleted

## 2020-02-17 DIAGNOSIS — Z952 Presence of prosthetic heart valve: Secondary | ICD-10-CM

## 2020-02-17 DIAGNOSIS — Z7901 Long term (current) use of anticoagulants: Secondary | ICD-10-CM | POA: Diagnosis not present

## 2020-02-17 LAB — POCT INR: INR: 2.4 (ref 2.0–3.0)

## 2020-02-17 NOTE — Patient Instructions (Signed)
Description   Called spoke with pt and advised to take 2 tablets today then continue taking 1.5 tablets daily except for 2 tablets on Thursdays. Recheck INR in 1 week.  [facility]in Clinic [phone].

## 2020-02-24 ENCOUNTER — Ambulatory Visit (INDEPENDENT_AMBULATORY_CARE_PROVIDER_SITE_OTHER): Payer: Medicare HMO | Admitting: *Deleted

## 2020-02-24 ENCOUNTER — Other Ambulatory Visit: Payer: Self-pay | Admitting: Family Medicine

## 2020-02-24 DIAGNOSIS — E1159 Type 2 diabetes mellitus with other circulatory complications: Secondary | ICD-10-CM

## 2020-02-24 DIAGNOSIS — Z7901 Long term (current) use of anticoagulants: Secondary | ICD-10-CM

## 2020-02-24 DIAGNOSIS — Z952 Presence of prosthetic heart valve: Secondary | ICD-10-CM | POA: Diagnosis not present

## 2020-02-24 DIAGNOSIS — I152 Hypertension secondary to endocrine disorders: Secondary | ICD-10-CM

## 2020-02-24 DIAGNOSIS — E119 Type 2 diabetes mellitus without complications: Secondary | ICD-10-CM

## 2020-02-24 LAB — POCT INR: INR: 2.4 (ref 2.0–3.0)

## 2020-02-24 NOTE — Patient Instructions (Signed)
Description   Called spoke with pt and advised to take 2 tablets today then start taking 1.5 tablets daily except for 2 tablets on Sundays and Thursdays. Recheck INR in 1 week.  [facility]in Clinic [phone].

## 2020-03-02 ENCOUNTER — Ambulatory Visit (HOSPITAL_COMMUNITY): Payer: Medicare HMO | Attending: Cardiovascular Disease

## 2020-03-02 ENCOUNTER — Other Ambulatory Visit: Payer: Self-pay

## 2020-03-02 DIAGNOSIS — Z952 Presence of prosthetic heart valve: Secondary | ICD-10-CM

## 2020-03-02 LAB — ECHOCARDIOGRAM COMPLETE
AR max vel: 0.69 cm2
AV Area VTI: 0.83 cm2
AV Area mean vel: 0.75 cm2
AV Mean grad: 30.5 mmHg
AV Peak grad: 52.6 mmHg
Ao pk vel: 3.63 m/s
Area-P 1/2: 2.95 cm2
S' Lateral: 2.8 cm

## 2020-03-03 ENCOUNTER — Ambulatory Visit (INDEPENDENT_AMBULATORY_CARE_PROVIDER_SITE_OTHER): Payer: Medicare HMO | Admitting: *Deleted

## 2020-03-03 DIAGNOSIS — Z952 Presence of prosthetic heart valve: Secondary | ICD-10-CM | POA: Diagnosis not present

## 2020-03-03 DIAGNOSIS — Z7901 Long term (current) use of anticoagulants: Secondary | ICD-10-CM | POA: Diagnosis not present

## 2020-03-03 LAB — POCT INR: INR: 4.1 — AB (ref 2.0–3.0)

## 2020-03-05 ENCOUNTER — Telehealth: Payer: Self-pay

## 2020-03-05 DIAGNOSIS — Z952 Presence of prosthetic heart valve: Secondary | ICD-10-CM

## 2020-03-05 MED ORDER — METOPROLOL TARTRATE 50 MG PO TABS: ORAL_TABLET | ORAL | 0 refills | Status: DC

## 2020-03-05 NOTE — Telephone Encounter (Signed)
The patient has been notified of the result and verbalized understanding.  All questions (if any) were answered.  Cardiac CT ordered reviewed instructions with patient.    Your cardiac CT will be scheduled at one of the below locations:   [facility] [address] [place], [place] [address] [phone]  [place] [address] [place], [place] [address] [phone]  If scheduled at [facility], please arrive at the [facility] main entrance of [facility] 30 minutes prior to test start time. Proceed to the [facility] Radiology Department (first floor) to check-in and test prep.  If scheduled at [facility], please arrive 15 mins early for check-in and test prep.  Please follow these instructions carefully (unless otherwise directed):   On the Night Before the Test: . Be sure to Drink plenty of water. . Do not consume any caffeinated/decaffeinated beverages or chocolate 12 hours prior to your test. . Do not take any antihistamines 12 hours prior to your test.  On the Day of the Test: . Drink plenty of water. Do not drink any water within one hour of the test. . Do not eat any food 4 hours prior to the test. . You may take your regular medications prior to the test.  . Take metoprolol (Lopressor102m) two hours prior to test. . FEMALES- please wear underwire-free bra if available . Do Not take Jardiance or Ozempic the morning of procedure        After the Test: . Drink plenty of water. . After receiving IV contrast, you may experience a mild flushed feeling. This is normal. . On occasion, you may experience a mild rash up to 24 hours after the test. This is not dangerous. If this occurs, you can take Benadryl 25 mg and increase your fluid intake. . If you experience trouble breathing, this can be serious. If it is severe call 911 IMMEDIATELY. If it is mild, please  call our office.  Once we have confirmed authorization from your insurance company, we will call you to set up a date and time for your test. Based on how quickly your insurance processes prior authorizations requests, please allow up to 4 weeks to be contacted for scheduling your Cardiac CT appointment. Be advised that routine Cardiac CT appointments could be scheduled as many as 8 weeks after your provider has ordered it.  For non-scheduling related questions, please contact the cardiac imaging nurse navigator should you have any questions/concerns: SMarchia Bond Cardiac Imaging Nurse Navigator MBur[facility] Saver Interim Cardiac Imaging Nurse [facility] Direct Office Dial: [phone]  For scheduling needs, including cancellations and rescheduling, please call [place] [phone]

## 2020-03-05 NOTE — Telephone Encounter (Signed)
-----   Message from Jettie Booze, MD sent at 03/03/2020  3:20 PM EST ----- Normal LV function.  Increased velocity of blood through the valve which could represent some renarrowing of the valve.  To get a better lok, Dr. Audie Box recommends: "Would recommend gated cardiac CTA for further evaluation. "  OK to order. Thanks.

## 2020-03-10 ENCOUNTER — Ambulatory Visit (INDEPENDENT_AMBULATORY_CARE_PROVIDER_SITE_OTHER): Payer: Medicare HMO | Admitting: *Deleted

## 2020-03-10 DIAGNOSIS — Z7901 Long term (current) use of anticoagulants: Secondary | ICD-10-CM

## 2020-03-10 DIAGNOSIS — Z952 Presence of prosthetic heart valve: Secondary | ICD-10-CM

## 2020-03-10 LAB — POCT INR: INR: 2.6 (ref 2.0–3.0)

## 2020-03-10 NOTE — Patient Instructions (Signed)
Description   Called spoke with pt and advised to continue taking 1.5 tablets daily except for 2 tablets on Sundays and Thursdays. Recheck INR in 1 week.  Coumadin Clinic [phone].      

## 2020-03-17 ENCOUNTER — Ambulatory Visit (INDEPENDENT_AMBULATORY_CARE_PROVIDER_SITE_OTHER): Payer: Medicare HMO | Admitting: *Deleted

## 2020-03-17 DIAGNOSIS — Z5181 Encounter for therapeutic drug level monitoring: Secondary | ICD-10-CM

## 2020-03-17 LAB — POCT INR: INR: 2.6 (ref 2.0–3.0)

## 2020-03-17 NOTE — Patient Instructions (Signed)
Description   Called spoke with pt and advised to continue taking 1.5 tablets daily except for 2 tablets on Sundays and Thursdays. Recheck INR in 1 week.  Coumadin Clinic [phone].

## 2020-03-22 DIAGNOSIS — Z03818 Encounter for observation for suspected exposure to other biological agents ruled out: Secondary | ICD-10-CM | POA: Diagnosis not present

## 2020-03-22 DIAGNOSIS — U071 COVID-19: Secondary | ICD-10-CM | POA: Diagnosis not present

## 2020-03-31 ENCOUNTER — Ambulatory Visit (INDEPENDENT_AMBULATORY_CARE_PROVIDER_SITE_OTHER): Payer: Medicare HMO | Admitting: Cardiology

## 2020-03-31 DIAGNOSIS — Z5181 Encounter for therapeutic drug level monitoring: Secondary | ICD-10-CM

## 2020-03-31 LAB — POCT INR: INR: 4.5 — AB (ref 2.0–3.0)

## 2020-03-31 NOTE — Patient Instructions (Signed)
Description   Called spoke with pt instructed her to hold warfarin today and continue taking  warfarin 1.5 tablets daily except for 2 tablets on Sundays and Thursdays. Recheck INR in 1 week.  C[facility]madin Clinic [phone].

## 2020-04-01 NOTE — Telephone Encounter (Signed)
[employer] rep called to provide an authorization for patient's cardiac CT. She states the authorization is valid from 03/31/20 - 09/28/20. Authorization #: [id number]

## 2020-04-07 ENCOUNTER — Ambulatory Visit (INDEPENDENT_AMBULATORY_CARE_PROVIDER_SITE_OTHER): Payer: Medicare HMO | Admitting: *Deleted

## 2020-04-07 DIAGNOSIS — Z952 Presence of prosthetic heart valve: Secondary | ICD-10-CM | POA: Diagnosis not present

## 2020-04-07 DIAGNOSIS — Z7901 Long term (current) use of anticoagulants: Secondary | ICD-10-CM

## 2020-04-07 LAB — POCT INR: INR: 3.3 — AB (ref 2.0–3.0)

## 2020-04-07 NOTE — Patient Instructions (Signed)
Description   Called spoke with pt instructed her to continue taking  warfarin 1.5 tablets daily except for 2 tablets on Sundays and Thursdays. Recheck INR in 1 week. Coumadin Clinic [phone].

## 2020-04-14 ENCOUNTER — Other Ambulatory Visit: Payer: Self-pay

## 2020-04-14 ENCOUNTER — Ambulatory Visit (INDEPENDENT_AMBULATORY_CARE_PROVIDER_SITE_OTHER): Payer: Medicare HMO | Admitting: *Deleted

## 2020-04-14 DIAGNOSIS — E782 Mixed hyperlipidemia: Secondary | ICD-10-CM

## 2020-04-14 DIAGNOSIS — Z952 Presence of prosthetic heart valve: Secondary | ICD-10-CM

## 2020-04-14 DIAGNOSIS — Z7901 Long term (current) use of anticoagulants: Secondary | ICD-10-CM | POA: Diagnosis not present

## 2020-04-14 LAB — POCT INR: INR: 3.3 — AB (ref 2.0–3.0)

## 2020-04-14 NOTE — Progress Notes (Signed)
Patient expressed that she would like to have labs drawn for Cardiac CT in [place].  Order for BMP placed and released to be drawn at [facility] in [place].

## 2020-04-14 NOTE — Patient Instructions (Signed)
Description   Called spoke with pt instructed her to continue taking warfarin 1.5 tablets daily except for 2 tablets on Sundays and Thursdays. Recheck INR in 1 week. Coumadin Clinic [phone].

## 2020-04-21 ENCOUNTER — Ambulatory Visit (INDEPENDENT_AMBULATORY_CARE_PROVIDER_SITE_OTHER): Payer: Medicare HMO | Admitting: *Deleted

## 2020-04-21 DIAGNOSIS — Z952 Presence of prosthetic heart valve: Secondary | ICD-10-CM | POA: Diagnosis not present

## 2020-04-21 DIAGNOSIS — Z7901 Long term (current) use of anticoagulants: Secondary | ICD-10-CM

## 2020-04-21 LAB — POCT INR: INR: 3.8 — AB (ref 2.0–3.0)

## 2020-04-21 NOTE — Patient Instructions (Signed)
Description   Called spoke with pt instructed her to hold today's dose then continue taking warfarin 1.5 tablets daily except for 2 tablets on Sundays. Recheck INR in 1 week. Coumadin Clinic [phone].

## 2020-04-22 ENCOUNTER — Telehealth (HOSPITAL_COMMUNITY): Payer: Self-pay | Admitting: Emergency Medicine

## 2020-04-22 DIAGNOSIS — E782 Mixed hyperlipidemia: Secondary | ICD-10-CM | POA: Diagnosis not present

## 2020-04-22 NOTE — Telephone Encounter (Signed)
Reaching out to patient to offer assistance regarding upcoming cardiac imaging study; pt verbalizes understanding of appt date/time, parking situation and where to check in, pre-test NPO status and medications ordered, and verified current allergies; name and call back number provided for further questions should they arise Kjirsten Bloodgood RN Navigator Cardiac Imaging [facility] [phone] office [phone] cell 

## 2020-04-22 NOTE — Telephone Encounter (Signed)
Attempted to call patient regarding upcoming cardiac CT appointment. Left message on voicemail with name and callback number Marchia Bond RN Navigator Cardiac Imaging [facility] [phone] Office [phone] Cell   Requested patient have labs drawn for CCTA

## 2020-04-23 LAB — BASIC METABOLIC PANEL
BUN/Creatinine Ratio: 26 (ref 12–28)
BUN: 20 mg/dL (ref 8–27)
CO2: 20 mmol/L (ref 20–29)
Calcium: 9.1 mg/dL (ref 8.7–10.3)
Chloride: 104 mmol/L (ref 96–106)
Creatinine, Ser: 0.76 mg/dL (ref 0.57–1.00)
Glucose: 181 mg/dL — ABNORMAL HIGH (ref 65–99)
Potassium: 4.2 mmol/L (ref 3.5–5.2)
Sodium: 141 mmol/L (ref 134–144)
eGFR: 86 mL/min/{1.73_m2} (ref >59)

## 2020-04-26 ENCOUNTER — Ambulatory Visit (HOSPITAL_COMMUNITY)
Admission: RE | Admit: 2020-04-26 | Discharge: 2020-04-26 | Disposition: A | Discharge status: Home / Self Care | Payer: Medicare HMO | Source: Ambulatory Visit | Attending: Interventional Cardiology | Admitting: Interventional Cardiology

## 2020-04-26 ENCOUNTER — Other Ambulatory Visit: Payer: Self-pay

## 2020-04-26 DIAGNOSIS — Z952 Presence of prosthetic heart valve: Secondary | ICD-10-CM | POA: Diagnosis present

## 2020-04-26 DIAGNOSIS — I700 Atherosclerosis of aorta: Secondary | ICD-10-CM | POA: Insufficient documentation

## 2020-04-26 DIAGNOSIS — I719 Aortic aneurysm of unspecified site, without rupture: Secondary | ICD-10-CM | POA: Diagnosis not present

## 2020-04-26 DIAGNOSIS — I712 Thoracic aortic aneurysm, without rupture: Secondary | ICD-10-CM | POA: Diagnosis not present

## 2020-04-26 MED ORDER — NITROGLYCERIN 0.4 MG SL SUBL
SUBLINGUAL_TABLET | SUBLINGUAL | Status: AC
  Filled 2020-04-26: qty 2

## 2020-04-26 MED ORDER — IOHEXOL 350 MG/ML SOLN
80.0000 mL | Freq: Once | INTRAVENOUS | Status: AC | PRN
  Administered 2020-04-26: 80 mL via INTRAVENOUS

## 2020-04-26 MED ORDER — METOPROLOL TARTRATE 5 MG/5ML IV SOLN: 5.0000 mg | INTRAVENOUS | Status: DC | PRN

## 2020-04-26 MED ORDER — NITROGLYCERIN 0.4 MG SL SUBL
0.8000 mg | SUBLINGUAL_TABLET | Freq: Once | SUBLINGUAL | Status: AC | PRN
  Administered 2020-04-26: 0.8 mg via SUBLINGUAL

## 2020-04-26 NOTE — Progress Notes (Signed)
CT coronary imaging completed.  Provided with snack post-imaging. PIV removed and discharged to lobby in stable condition. Ambulatory independently.

## 2020-04-28 ENCOUNTER — Ambulatory Visit (INDEPENDENT_AMBULATORY_CARE_PROVIDER_SITE_OTHER): Payer: Medicare HMO | Admitting: *Deleted

## 2020-04-28 DIAGNOSIS — Z952 Presence of prosthetic heart valve: Secondary | ICD-10-CM | POA: Diagnosis not present

## 2020-04-28 DIAGNOSIS — Z7901 Long term (current) use of anticoagulants: Secondary | ICD-10-CM

## 2020-04-28 LAB — POCT INR: INR: 2.6 (ref 2.0–3.0)

## 2020-04-28 NOTE — Patient Instructions (Signed)
Description   Called spoke with pt instructed her to hold today's dose then continue taking warfarin 1.5 tablets daily except for 2 tablets on Sundays. Recheck INR in 2 weeks. [facility]madin Clinic [phone].

## 2020-04-30 ENCOUNTER — Telehealth: Payer: Self-pay | Admitting: *Deleted

## 2020-04-30 NOTE — Telephone Encounter (Signed)
Jettie Booze, MD  Thompson Grayer, RN; P [facility] Triage No need for TEE. Watch for sx of CP, SHOB, DOE, syncope, presyncope. Will follow LV function with regular echocardiograms.   JV   Patient notified.  She is not having any symptoms at this time.  Yearly follow up appointment scheduled for April 14,2022 at 10:40

## 2020-05-12 ENCOUNTER — Ambulatory Visit (INDEPENDENT_AMBULATORY_CARE_PROVIDER_SITE_OTHER): Payer: Medicare HMO

## 2020-05-12 DIAGNOSIS — Z952 Presence of prosthetic heart valve: Secondary | ICD-10-CM

## 2020-05-12 DIAGNOSIS — Z7901 Long term (current) use of anticoagulants: Secondary | ICD-10-CM | POA: Diagnosis not present

## 2020-05-12 LAB — POCT INR: INR: 3.2 — AB (ref 2.0–3.0)

## 2020-05-12 NOTE — Patient Instructions (Signed)
Description   Called spoke with pt instructed her to continue taking warfarin 1.5 tablets daily except for 2 tablets on Sundays. Recheck INR in 2 weeks. Coumadin Clinic [phone].

## 2020-05-26 ENCOUNTER — Ambulatory Visit (INDEPENDENT_AMBULATORY_CARE_PROVIDER_SITE_OTHER): Payer: Medicare HMO | Admitting: Cardiology

## 2020-05-26 DIAGNOSIS — Z5181 Encounter for therapeutic drug level monitoring: Secondary | ICD-10-CM | POA: Diagnosis not present

## 2020-05-26 DIAGNOSIS — Z952 Presence of prosthetic heart valve: Secondary | ICD-10-CM | POA: Diagnosis not present

## 2020-05-26 DIAGNOSIS — Z7901 Long term (current) use of anticoagulants: Secondary | ICD-10-CM | POA: Diagnosis not present

## 2020-05-26 LAB — POCT INR: INR: 2.6 (ref 2.0–3.0)

## 2020-05-26 NOTE — Patient Instructions (Signed)
Description   Called spoke with pt instructed her to continue taking warfarin 1.5 tablets daily except for 2 tablets on Sundays. Recheck INR in 2 weeks. Coumadin Clinic [phone].

## 2020-05-28 ENCOUNTER — Other Ambulatory Visit: Payer: Self-pay | Admitting: Internal Medicine

## 2020-05-28 DIAGNOSIS — Z1231 Encounter for screening mammogram for malignant neoplasm of breast: Secondary | ICD-10-CM

## 2020-06-01 NOTE — Progress Notes (Signed)
Cardiology Office Note   Date:  06/03/2020   ID:  Zariana, Strub April 15, 1955, MRN [id number]  PCP:  Denise Boston, MD    No chief complaint on file.  S/p AVR  Wt Readings from Last 3 Encounters:  06/03/20 250 lb (113.4 kg)  01/30/20 255 lb (115.7 kg)  11/12/19 255 lb 6.4 oz (115.8 kg)       History of Present Illness: Denise Curtis is a 66 y.o. female   who is being seen today for the evaluation of valvular heart diseaseat the request of Denise Curtis,*.  She saw me to establish with a cardiologist after valve replacement surgery. She moved to [place] from [place] in 9/19. Her husband's job brought her here.  It was a [employer] aortic valve: Serial number: [id number]; Model # [id number]; placed in Mar 05 2014; Dr. Owens Curtis at [facility].   Done for bicuspid aortic valve. Per her report, cath was clean prior to surgery.   She has been following a range of 2.5 - 3.5. She has a home machine.  echo showed increased aortic vlave velocity.  CTA was done showing: "There is a high attenuation layer (HU >145) extending from the sewing ring to the leaflet base consistent with pannus. There is no thrombus present."    Husband had severe COVID in 12/21 along with influenza.  She had COVID in 2/22.  Got it from her granddaughter.  It was a mild case.  Whole family was vaccinated.   Denies : Chest pain. Dizziness. Leg edema. Nitroglycerin use. Orthopnea. Palpitations. Paroxysmal nocturnal dyspnea. Shortness of breath. Syncope.   Back to walking regularly.      Past Medical History:  Diagnosis Date  . Arthritis    knee  . Diabetes mellitus type II, non insulin dependent ([place])   . Heart murmur   . Hyperlipidemia   . Hypertension   . Migraines     Past Surgical History:  Procedure Laterality Date  . APPENDECTOMY  2005  . COLONOSCOPY    . MECHANICAL AORTIC VALVE REPLACEMENT  2016     Current Outpatient Medications   Medication Sig Dispense Refill  . albuterol (PROVENTIL HFA;VENTOLIN HFA) 108 (90 Base) MCG/ACT inhaler Inhale 1-2 puffs into the lungs every 6 (six) hours as needed for wheezing. 1 Inhaler 0  . atorvastatin (LIPITOR) 40 MG tablet Take 1 tablet (40 mg total) by mouth daily. 90 tablet 1  . clindamycin (CLEOCIN) 300 MG capsule Take 300 mg by mouth 2 (two) times daily. Prior to dental procedure    . JARDIANCE 25 MG TABS tablet TAKE 1 TABLET BY MOUTH EVERY DAY 60 tablet 2  . lisinopril (ZESTRIL) 5 MG tablet Take 5 mg by mouth daily.    . metoprolol succinate (TOPROL-XL) 25 MG 24 hr tablet Take 1 tablet (25 mg total) by mouth daily. 90 tablet 0  . metoprolol tartrate (LOPRESSOR) 50 MG tablet Take 1 tablet 2 hours prior to CT Scan. 1 tablet 0  . Semaglutide (OZEMPIC, 0.25 OR 0.5 MG/DOSE, [place]) Inject 0.5 mg into the skin. Once weekly    . warfarin (COUMADIN) 5 MG tablet TAKE AS DIRECTED. 60 DAY SUPPLY 150 tablet 1   No current facility-administered medications for this visit.    Allergies:   Metformin and related, Hydrochlorothiazide, and Penicillins    Social History:  The patient  reports that she has never smoked. She has never used smokeless tobacco. She reports current alcohol  use. She reports that she does not use drugs.   Family History:  The patient's family history includes COPD in her father and mother; Heart disease in her mother; Squamous cell carcinoma (age of onset: 49) in her mother; Stroke in her paternal grandfather.    ROS:  Please see the history of present illness.   Otherwise, review of systems are positive for weight loss- intentional.   All other systems are reviewed and negative.    PHYSICAL EXAM: VS:  BP 136/82   Pulse 80   Ht 5\' 1"  (1.549 m)   Wt 250 lb (113.4 kg)   BMI 47.24 kg/m  , BMI Body mass index is 47.24 kg/m. GEN: Well nourished, well developed, in no acute distress  HEENT: normal  Neck: no JVD, carotid bruits, or masses Cardiac: RRR; crisp S2 click,  2/6 early systolic murmur,  rubs, or gallops,no edema  Respiratory:  clear to auscultation bilaterally, normal work of breathing GI: soft, nontender, nondistended, + BS MS: no deformity or atrophy  Skin: warm and dry, no rash Neuro:  Strength and sensation are intact Psych: euthymic mood, full affect   EKG:   The ekg ordered today demonstrates NSR, nonspecific ST changes   Recent Labs: 04/22/2020: BUN 20; Creatinine, Ser 0.76; Potassium 4.2; Sodium 141   Lipid Panel    Component Value Date/Time   CHOL 156 03/04/2018 0956   TRIG 177.0 (H) 03/04/2018 0956   HDL 39.00 (L) 03/04/2018 0956   CHOLHDL 4 03/04/2018 0956   VLDL 35.4 03/04/2018 0956   LDLCALC 81 03/04/2018 0956   LDLDIRECT 95.0 03/04/2018 0956     Other studies Reviewed: Additional studies/ records that were reviewed today with results demonstrating: labs reviewed.   ASSESSMENT AND PLAN:  1. S/p AVR: Aortic valve dysfunction noted by Dr. Audie Curtis. CT report showed: "There is a high attenuation layer (HU >145) extending from the sewing ring to the leaflet base consistent with pannus. There is no thrombus present."  Refer to Dr. Cyndia Curtis.   Plan on annual echocardiogram to check for any change in heart function.  We would do echocardiogram sooner if he had any change in her ability to exert herself or other symptoms 2. DM: A1C 6.0 in 11/21.  3. Morbid obesity: Continue exercise.  Getting back to healthy diet.  4. Hyperlipidemia: LDL 72 in 4/21. TO be rechecked with Dr. Jacalyn Curtis in 5/22.  Refill atorvastatin, 3 months supply.  5. Anticoagulated: Checks coumadin at home.  She took Lovenox while bridging for colonoscopy.  Back on a normal schedule.   Current medicines are reviewed at length with the patient today.  The patient concerns regarding her medicines were addressed.  The following changes have been made:  No change  Labs/ tests ordered today include:  No orders of the defined types were placed in this  encounter.   Recommend 150 minutes/week of aerobic exercise Low fat, low carb, high fiber diet recommended  Disposition:   FU in 1 year   Signed, Denise Grooms, MD  06/03/2020 10:58 AM    [facility] [address], [place], [place]  [address] Phone: [phone]; Fax: [phone]

## 2020-06-03 ENCOUNTER — Other Ambulatory Visit: Payer: Self-pay

## 2020-06-03 ENCOUNTER — Ambulatory Visit: Payer: Medicare HMO | Admitting: Interventional Cardiology

## 2020-06-03 ENCOUNTER — Encounter: Payer: Self-pay | Admitting: Interventional Cardiology

## 2020-06-03 VITALS — BP 136/82 | HR 80 | Ht 61.0 in | Wt 250.0 lb

## 2020-06-03 DIAGNOSIS — Z952 Presence of prosthetic heart valve: Secondary | ICD-10-CM

## 2020-06-03 DIAGNOSIS — E782 Mixed hyperlipidemia: Secondary | ICD-10-CM

## 2020-06-03 DIAGNOSIS — E785 Hyperlipidemia, unspecified: Secondary | ICD-10-CM

## 2020-06-03 DIAGNOSIS — E1169 Type 2 diabetes mellitus with other specified complication: Secondary | ICD-10-CM

## 2020-06-03 DIAGNOSIS — I77810 Thoracic aortic ectasia: Secondary | ICD-10-CM | POA: Diagnosis not present

## 2020-06-03 DIAGNOSIS — E119 Type 2 diabetes mellitus without complications: Secondary | ICD-10-CM

## 2020-06-03 MED ORDER — ATORVASTATIN CALCIUM 40 MG PO TABS: 40.0000 mg | ORAL_TABLET | Freq: Every day | ORAL | 3 refills | Status: DC

## 2020-06-03 NOTE — Patient Instructions (Signed)
Medication Instructions:  Your physician recommends that you continue on your current medications as directed. Please refer to the Current Medication list given to you today.  *If you need a refill on your cardiac medications before your next appointment, please call your pharmacy*   Lab Work: none If you have labs (blood work) drawn today and your tests are completely normal, you will receive your results only by: Marland Kitchen MyChart Message (if you have MyChart) OR . A paper copy in the mail If you have any lab test that is abnormal or we need to change your treatment, we will call you to review the results.   Testing/Procedures: Your physician has requested that you have an echocardiogram. Echocardiography is a painless test that uses sound waves to create images of your heart. It provides your doctor with information about the size and shape of your heart and how well your heart's chambers and valves are working. This procedure takes approximately one hour. There are no restrictions for this procedure. To be done in January 2023   Follow-Up: At [facility], you and your health needs are our priority.  As part of our continuing mission to provide you with exceptional heart care, we have created designated Provider Care Teams.  These Care Teams include your primary Cardiologist (physician) and Advanced Practice Providers (APPs -  Physician Assistants and Nurse Practitioners) who all work together to provide you with the care you need, when you need it.  We recommend signing up for the patient portal called "MyChart".  Sign up information is provided on this After Visit Summary.  MyChart is used to connect with patients for Virtual Visits (Telemedicine).  Patients are able to view lab/test results, encounter notes, upcoming appointments, etc.  Non-urgent messages can be sent to your provider as well.   To learn more about what you can do with MyChart, go to [email or web address].    Your next  appointment:   9 month(s)--January/Feb 2023 after echocardiogram  The format for your next appointment:   In Person  Provider:   You may see Larae Grooms, MD or one of the following Advanced Practice Providers on your designated Care Team:    Melina Copa, PA-C  Ermalinda Barrios, PA-C    Other Instructions You have been referred to Dr Cyndia Bent

## 2020-06-04 ENCOUNTER — Ambulatory Visit: Payer: Medicare HMO

## 2020-06-09 ENCOUNTER — Ambulatory Visit (INDEPENDENT_AMBULATORY_CARE_PROVIDER_SITE_OTHER): Payer: Medicare HMO | Admitting: Cardiology

## 2020-06-09 DIAGNOSIS — Z5181 Encounter for therapeutic drug level monitoring: Secondary | ICD-10-CM | POA: Diagnosis not present

## 2020-06-09 LAB — POCT INR: INR: 3.3 — AB (ref 2.0–3.0)

## 2020-06-09 NOTE — Patient Instructions (Signed)
Description   Called spoke with pt instructed her to continue taking warfarin 1.5 tablets daily except for 2 tablets on Sundays. Recheck INR in 2 weeks. Coumadin Clinic [phone].

## 2020-06-22 DIAGNOSIS — E785 Hyperlipidemia, unspecified: Secondary | ICD-10-CM | POA: Diagnosis not present

## 2020-06-22 DIAGNOSIS — E1159 Type 2 diabetes mellitus with other circulatory complications: Secondary | ICD-10-CM | POA: Diagnosis not present

## 2020-06-23 ENCOUNTER — Ambulatory Visit (INDEPENDENT_AMBULATORY_CARE_PROVIDER_SITE_OTHER): Payer: Medicare HMO | Admitting: *Deleted

## 2020-06-23 ENCOUNTER — Encounter: Payer: Self-pay | Admitting: Surgery

## 2020-06-23 ENCOUNTER — Other Ambulatory Visit: Payer: Self-pay

## 2020-06-23 ENCOUNTER — Institutional Professional Consult (permissible substitution): Payer: Medicare HMO | Admitting: Surgery

## 2020-06-23 VITALS — BP 147/84 | HR 84 | Resp 20 | Ht 61.0 in | Wt 250.0 lb

## 2020-06-23 DIAGNOSIS — Z952 Presence of prosthetic heart valve: Secondary | ICD-10-CM | POA: Diagnosis not present

## 2020-06-23 DIAGNOSIS — Z7901 Long term (current) use of anticoagulants: Secondary | ICD-10-CM | POA: Diagnosis not present

## 2020-06-23 DIAGNOSIS — I350 Nonrheumatic aortic (valve) stenosis: Secondary | ICD-10-CM

## 2020-06-23 LAB — POCT INR: INR: 3.3 — AB (ref 2.0–3.0)

## 2020-06-23 NOTE — Progress Notes (Signed)
PCP is Wile, Jesse Sans, MD Referring Provider is Jettie Booze, MD  Chief Complaint  Patient presents with  . Aortic Stenosis    Surgical consult    HPI:  The patient is a 65 year old woman with a history of hypertension, hyperlipidemia, type II diabetes, morbid obesity, and degenerative joint disease of the knees who underwent aortic valve replacement in 2016 in [place] at [facility] for bicuspid aortic valve stenosis.  Denise Curtis said that her cardiac catheterization preoperatively was normal and Denise Curtis was never told about any problem with her aorta.  Denise Curtis was very symptomatic prior to her surgery and immediately improved and was back to normal activity.  Denise Curtis said that Denise Curtis had an echocardiogram for follow-up a few years later which Denise Curtis was told was normal.  Denise Curtis moved to [place] in [place].  Denise Curtis has continued to feel well without shortness of breath or fatigue.  Denise Curtis has had no orthopnea or PND.  Denise Curtis denies peripheral edema.  Denise Curtis had a 2D echocardiogram in January 2021 which showed a mean gradient across aortic valve prosthesis of 41 mmHg with a peak gradient of 64 mmHg.  Left ventricular ejection fraction was 60 to 65% with moderate LVH.  Denise Curtis had a follow-up echocardiogram in January 2022 which showed the mean gradient across the aortic valve to be down slightly at 30.5 mmHg.  Peak gradient was 52.6 mmHg.  Left ventricular ejection fraction remained 60 to 65% with mild LVH and normal LV internal dimensions.  The ascending aorta was measured at 44 mm.  Denise Curtis had a gated cardiac CTA on 04/26/2020 which showed a high attenuation layer extending from the sewing ring to the leaflet base consistent with pannus.  There is no thrombus present.  The opening angle was 56.7 degrees with a normal closing angle of 123 degrees.  The sinuses of Valsalva measured 40 mm with the sinotubular junction of 41 mm and an ascending aorta of 48 mm.  Coronary artery origin was normal with right dominance.  Left ventricular  ejection fraction was 72%.  The patient lives with her husband in [place] who is now retired.  Denise Curtis gets out to walk daily. Past Medical History:  Diagnosis Date  . Arthritis    knee  . Diabetes mellitus type II, non insulin dependent ([place])   . Heart murmur   . Hyperlipidemia   . Hypertension   . Migraines     Past Surgical History:  Procedure Laterality Date  . APPENDECTOMY  2005  . COLONOSCOPY    . MECHANICAL AORTIC VALVE REPLACEMENT  2016    Family History  Problem Relation Age of Onset  . COPD Mother   . Heart disease Mother   . Squamous cell carcinoma Mother 57  . COPD Father   . Stroke Paternal Grandfather   . Colon cancer Neg Hx   . Esophageal cancer Neg Hx   . Rectal cancer Neg Hx   . Stomach cancer Neg Hx     Social History Social History   Tobacco Use  . Smoking status: Never Smoker  . Smokeless tobacco: Never Used  Vaping Use  . Vaping Use: Never used  Substance Use Topics  . Alcohol use: Yes    Comment: occasional  . Drug use: Never    Current Outpatient Medications  Medication Sig Dispense Refill  . albuterol (PROVENTIL HFA;VENTOLIN HFA) 108 (90 Base) MCG/ACT inhaler Inhale 1-2 puffs into the lungs every 6 (six) hours as needed for wheezing. 1  Inhaler 0  . atorvastatin (LIPITOR) 40 MG tablet Take 1 tablet (40 mg total) by mouth daily. 90 tablet 3  . clindamycin (CLEOCIN) 300 MG capsule Take 300 mg by mouth 2 (two) times daily. Prior to dental procedure    . JARDIANCE 25 MG TABS tablet TAKE 1 TABLET BY MOUTH EVERY DAY 60 tablet 2  . lisinopril (ZESTRIL) 5 MG tablet Take 5 mg by mouth daily.    . metoprolol succinate (TOPROL-XL) 25 MG 24 hr tablet Take 1 tablet (25 mg total) by mouth daily. 90 tablet 0  . metoprolol tartrate (LOPRESSOR) 50 MG tablet Take 1 tablet 2 hours prior to CT Scan. 1 tablet 0  . Semaglutide (OZEMPIC, 0.25 OR 0.5 MG/DOSE, ) Inject 0.5 mg into the skin. Once weekly    . warfarin (COUMADIN) 5 MG tablet TAKE AS DIRECTED. 60  DAY SUPPLY 150 tablet 1   No current facility-administered medications for this visit.    Allergies  Allergen Reactions  . Metformin And Related Nausea And Vomiting  . Hydrochlorothiazide Anxiety  . Penicillins Rash    Review of Systems  Constitutional: Negative for activity change, fatigue and unexpected weight change.  HENT: Negative.   Eyes: Negative.   Respiratory: Negative for shortness of breath.   Cardiovascular: Negative for chest pain, palpitations and leg swelling.  Gastrointestinal: Negative.   Endocrine: Negative.   Genitourinary: Negative.   Musculoskeletal: Positive for arthralgias and joint swelling.       Right knee  Neurological: Negative for dizziness and syncope.  Hematological: Negative.   Psychiatric/Behavioral: Negative.     BP (!) 147/84   Pulse 84   Resp 20   Ht 5\' 1"  (1.549 m)   Wt 250 lb (113.4 kg)   SpO2 98% Comment: RA  BMI 47.24 kg/m  Physical Exam Constitutional:      Appearance: Normal appearance. Denise Curtis is obese.  HENT:     Head: Normocephalic and atraumatic.  Eyes:     Extraocular Movements: Extraocular movements intact.     Conjunctiva/sclera: Conjunctivae normal.     Pupils: Pupils are equal, round, and reactive to light.  Neck:     Vascular: No carotid bruit.  Cardiovascular:     Rate and Rhythm: Normal rate and regular rhythm.     Pulses: Normal pulses.     Heart sounds: Murmur heard.      Comments: Crisp mechanical valve click  2/6 systolic murmur right sternal border Pulmonary:     Effort: Pulmonary effort is normal.     Breath sounds: Normal breath sounds.  Musculoskeletal:        General: No swelling.     Cervical back: No tenderness.  Skin:    General: Skin is warm and dry.  Neurological:     General: No focal deficit present.     Mental Status: Denise Curtis is alert and oriented to person, place, and time.  Psychiatric:        Mood and Affect: Mood normal.        Behavior: Behavior normal.      Diagnostic  Tests:  ECHOCARDIOGRAM REPORT       Patient Name:  Denise Curtis Date of Exam: 03/02/2020  Medical Rec #: [id number]      Height:    61.0 in  Accession #:  [id number]      Weight:    255.0 lb  Date of Birth: 1955-06-24      BSA:     2.094 m  Patient  Age:  39 years       BP:      142/77 mmHg  Patient Gender: F          HR:      76 bpm.  Exam Location: [address]   Procedure: 2D Echo, Cardiac Doppler and Color Doppler   Indications:  Z95.2 AVR    History:    Patient has prior history of Echocardiogram examinations,  most         recent 03/05/2019. S/p AVR (St Jude Mechanical); Risk         Factors:Hypertension, Diabetes, Dyslipidemia and Morbid  obesity.         Aortic Valve: 21 mm St. Jude mechanical valve is present  in the         aortic position. Procedure Date: 03/05/2014.    Sonographer:  Coralyn Helling RDCS  Referring Phys: [place]    1. High gradients across AoV mechanical valve as discussed below.  Aneurysm of the ascending aorta. Would recommend gated cardiac CTA to  evaluate valve function and to examine aorta in better detail.  2. 21 mm St Jude Mechanical valve in aortic position. V max 3.6 m/s, MG  30.5 mmHG, EOA 0.83 cm2 (0.39 cm2/m2), DI 0.29. The jet is early peaking  with AT ~70 msec. Vmax reported up to 4.0 m/s on last study. The jet is  early peaking and findings could  represent patient prosthesis mismatch given small valve (21 mm) and  patient size (BSA 2.09 m2). Would recommend gated cardiac CTA for further  evaluation. The aortic valve has been repaired/replaced. Aortic valve  regurgitation is not visualized. There is  a 21 mm St. Jude mechanical valve present in the aortic position.  Procedure Date: 03/05/2014.  3. Aortic dilatation noted. Aneurysm of the ascending aorta, measuring 44  mm. There is  mild dilatation of the aortic root, measuring 40 mm.  4. Left ventricular ejection fraction, by estimation, is 60 to 65%. The  left ventricle has normal function. The left ventricle has no regional  wall motion abnormalities. There is mild concentric left ventricular  hypertrophy. Left ventricular diastolic  parameters are consistent with Grade I diastolic dysfunction (impaired  relaxation).  5. Right ventricular systolic function is normal. The right ventricular  size is normal. There is normal pulmonary artery systolic pressure. The  estimated right ventricular systolic pressure is 96.2 mmHg.  6. The mitral valve is degenerative. Trivial mitral valve regurgitation.  No evidence of mitral stenosis.  7. The inferior vena cava is normal in size with greater than 50%  respiratory variability, suggesting right atrial pressure of 3 mmHg.   Comparison(s): No significant change from prior study. Gradients across  AoV remain high. Ascnending aortic aneurysm present.   FINDINGS  Left Ventricle: Left ventricular ejection fraction, by estimation, is 60  to 65%. The left ventricle has normal function. The left ventricle has no  regional wall motion abnormalities. The left ventricular internal cavity  size was normal in size. There is  mild concentric left ventricular hypertrophy. Abnormal (paradoxical)  septal motion consistent with post-operative status. Left ventricular  diastolic parameters are consistent with Grade I diastolic dysfunction  (impaired relaxation).   Right Ventricle: The right ventricular size is normal. No increase in  right ventricular wall thickness. Right ventricular systolic function is  normal. There is normal pulmonary artery systolic pressure. The tricuspid  regurgitant velocity is 2.35 m/s, and  with an assumed right atrial  pressure of 3 mmHg, the estimated right  ventricular systolic pressure is 62.7 mmHg.   Left Atrium: Left atrial size was normal in size.    Right Atrium: Right atrial size was normal in size.   Pericardium: Trivial pericardial effusion is present.   Mitral Valve: The mitral valve is degenerative in appearance. Mild mitral  annular calcification. Trivial mitral valve regurgitation. No evidence of  mitral valve stenosis.   Tricuspid Valve: The tricuspid valve is grossly normal. Tricuspid valve  regurgitation is trivial. No evidence of tricuspid stenosis.   Aortic Valve: 21 mm St Jude Mechanical valve in aortic position. V max 3.6  m/s, MG 30.5 mmHG, EOA 0.83 cm2 (0.39 cm2/m2), DI 0.29. The jet is early  peaking with AT ~70 msec. Vmax reported up to 4.0 m/s on last study. The  jet is early peaking and findings  could represent patient prosthesis mismatch given small valve (21 mm) and  patient size (BSA 2.09 m2). Would recommend gated cardiac CTA for further  evaluation. The aortic valve has been repaired/replaced. Aortic valve  regurgitation is not visualized.  Aortic valve mean gradient measures 30.5 mmHg. Aortic valve peak gradient  measures 52.6 mmHg. Aortic valve area, by VTI measures 0.83 cm. There is  a 21 mm St. Jude mechanical valve present in the aortic position.  Procedure Date: 03/05/2014.   Pulmonic Valve: The pulmonic valve was grossly normal. Pulmonic valve  regurgitation is not visualized. No evidence of pulmonic stenosis.   Aorta: Aortic dilatation noted. There is mild dilatation of the aortic  root, measuring 40 mm. There is an aneurysm involving the ascending aorta  measuring 44 mm.   Venous: The inferior vena cava is normal in size with greater than 50%  respiratory variability, suggesting right atrial pressure of 3 mmHg.   IAS/Shunts: The atrial septum is grossly normal.     LEFT VENTRICLE  PLAX 2D  LVIDd:     4.30 cm Diastology  LVIDs:     2.80 cm LV e' medial:  6.42 cm/s  LV PW:     1.20 cm LV E/e' medial: 13.7  LV IVS:    1.30 cm LV e' lateral:  9.25 cm/s  LVOT  diam:   1.90 cm LV E/e' lateral: 9.5  LV SV:     62  LV SV Index:  30  LVOT Area:   2.84 cm     RIGHT VENTRICLE      IVC  RVSP:      25.1 mmHg IVC diam: 1.00 cm   LEFT ATRIUM       Index    RIGHT ATRIUM      Index  LA diam:    3.10 cm 1.48 cm/m RA Pressure: 3.00 mmHg  LA Vol (A2C):  58.9 ml 28.13 ml/m RA Area:   16.40 cm  LA Vol (A4C):  52.2 ml 24.93 ml/m RA Volume:  42.40 ml 20.25 ml/m  LA Biplane Vol: 55.9 ml 26.69 ml/m  AORTIC VALVE  AV Area (Vmax):  0.69 cm  AV Area (Vmean):  0.75 cm  AV Area (VTI):   0.83 cm  AV Vmax:      362.53 cm/s  AV Vmean:     256.734 cm/s  AV VTI:      0.749 m  AV Peak Grad:   52.6 mmHg  AV Mean Grad:   30.5 mmHg  LVOT Vmax:     87.60 cm/s  LVOT Vmean:    68.000 cm/s  LVOT VTI:  0.220 m  LVOT/AV VTI ratio: 0.29    AORTA  Ao Root diam: 4.00 cm  Ao Asc diam: 4.40 cm   MV E velocity: 87.95 cm/s TRICUSPID VALVE  MV A velocity: 94.70 cm/s TR Peak grad:  22.1 mmHg  MV E/A ratio: 0.93    TR Vmax:    235.00 cm/s               Estimated RAP: 3.00 mmHg               RVSP:      25.1 mmHg                 SHUNTS               Systemic VTI: 0.22 m               Systemic Diam: 1.90 cm   Eleonore Chiquito MD  Electronically signed by Eleonore Chiquito MD  Signature Date/Time: 03/02/2020/1:46:25 PM    Addendum    ADDENDUM REPORT: 04/26/2020 21:50  CLINICAL DATA:  Prosthetic valve stenosis.  EXAM: Cardiac TAVR CT  TECHNIQUE: The patient was scanned on a [employer]. A 120 kV retrospective scan was triggered in the descending thoracic aorta at 111 HU's. Gantry rotation speed was 250 msecs and collimation was .6 mm. No beta blockade or nitro were given. The 3D data set was reconstructed in 5% intervals of the R-R cycle. Systolic and diastolic phases  were analyzed on a dedicated work station using MPR, MIP and VRT modes. The patient received 80 cc of contrast.  FINDINGS: Image quality: Excellent.  Noise artifact is: Limited.  Aortic Prosthesis: A 21 mm bileaflet St. Jude mechanical prosthesis is present in the aortic position. The sewing ring is well seated. There is no evidence of paravalvular leak. The closing angle is 123 degrees which is normal. The opening angle is 56.7 degrees which is reduced, consistent with prosthetic valve stenosis. There is a high attenuation layer (HU >145) extending from the sewing ring to the leaflet base consistent with pannus. There is no thrombus present.  Sinus of Valsalva Measurements:  Maximum diameter: 40 mm  Commissure to commissure: 32 mm  Sinotubular Junction: 41 mm  Ascending Thoracic Aorta: 48 mm  Coronary Arteries: Normal coronary origin. Right dominance. Coronary calcium score of 184 which is 85th percentile for age- and sex-matched controls. This study was performed without nitroglycerin and is inadequate for coronary stenosis assessment.  Cardiac Morphology:  Right Atrium: Right atrial size is within normal limits.  Right Ventricle: The right ventricular cavity is within normal limits.  Left Atrium: Left atrial size is normal in size with no left atrial appendage filling defect.  Left Ventricle: The ventricular cavity size is within normal limits. There are no stigmata of prior infarction. There is no abnormal filling defect. Normal LV function, LVEF=72%. Septal motion consistent with postoperative septum.  Pulmonary arteries: Normal in size without proximal filling defect.  Pulmonary veins: Normal pulmonary venous drainage.  Pericardium: Normal thickness with no significant effusion or calcium present.  Mitral Valve: The mitral valve is normal structure with mild mitral annular calcification.  Extra-cardiac findings: See attached radiology  report for non-cardiac structures.  IMPRESSION: 1. 21 mm bileaflet St. Jude mechanical prosthesis is present in the aortic position with reduced leaflet motion (opening angle 56.7 degrees) consistent with prosthetic valve stenosis.  2. There is a high attenuation layer (HU >145) extending from the sewing ring to the leaflet base  consistent with pannus which is the mechanism of prosthetic valve stenosis.  3. Coronary calcium score of 184 which is 85th percentile for age- and sex-matched controls. Inadequate study for coronary stenosis assessment.  4. Normal LV function, EF=72%.  5. Ascending aortic aneurysm up to 48 mm.  Lake Bells T. Audie Box, MD   Electronically Signed   By: Eleonore Chiquito   On: 04/26/2020 21:50   Addended by Geralynn Rile, MD on 04/26/2020 9:55 PM   Study Result  Narrative & Impression  EXAM: OVER-READ INTERPRETATION  CT CHEST  The following report is an over-read performed by radiologist Dr. Vinnie Langton of [facility], [place] on 04/26/2020. This over-read does not include interpretation of cardiac or coronary anatomy or pathology. The coronary calcium score/coronary CTA interpretation by the cardiologist is attached.  COMPARISON:  None.  FINDINGS: Aortic atherosclerosis with aneurysmal dilatation of the ascending thoracic aorta (4.8 cm in diameter). Small hiatal hernia. Several densely calcified mediastinal and hilar lymph nodes are incidentally noted. Within the visualized portions of the thorax there are no suspicious appearing pulmonary nodules or masses, there is no acute consolidative airspace disease, no pleural effusions, no pneumothorax and no lymphadenopathy. Visualized portions of the upper abdomen are unremarkable. There are no aggressive appearing lytic or blastic lesions noted in the visualized portions of the skeleton.  IMPRESSION: 1.  Aortic Atherosclerosis (ICD10-I70.0). 2. Aneurysmal dilatation of the  ascending thoracic aorta (4.8 cm in diameter). Aortic aneurysm NOS (ICD10-I71.9).  Electronically Signed: By: Vinnie Langton M.D. On: 04/26/2020 13:47       Impression:  This 65 year old woman has a mean gradient across her mechanical aortic valve prosthesis of 30 mmHg consistent with moderate prosthetic valve stenosis.  It was measured at 41 mmHg in January 2021.  Gated cardiac CTA shows pannus ingrowth extending from the sewing ring to the leaflet base with an opening leaflet angle of 56.7 degrees compared to the normal of about 11 degrees.  This likely means that there is restricted opening of the leaflets causing partial obstruction of the valve opening.  In addition Denise Curtis has a 21 mm valve with a BSA of 2.1 m which may mean that there is some patient prosthesis mismatch at baseline.  Her left ventricular systolic function is normal.  Denise Curtis is completely asymptomatic and relatively active.  In addition Denise Curtis was noted to have a 4.8 cm fusiform ascending aortic aneurysm.  It is unknown how large this was at the time of her initial surgery.  The aneurysm at this size would not trigger the need for surgical replacement although if Denise Curtis needed redo aortic valve replacement then the aorta would have to be replaced.  I think if Denise Curtis requires redo aortic valve replacement Denise Curtis would likely require aortic root replacement with a porcine root because I doubt a 21 mm valve could be reinserted.  Then the remainder of her aorta up to the innominate artery would need to be replaced.  This would be a major high risk surgery for this patient but I suspect it will need to be done at some point.  I think as long Denise Curtis has normal left ventricular function without worsening LVH and is asymptomatic it is reasonable to continue following this closely for a while.  Denise Curtis is scheduled for a follow-up echocardiogram in January 2023.  I will plan to repeat her CTA of the chest in March 2023 to reevaluate the thoracic aorta in  entirety.  I reviewed the CT and echo images with her  and answered all of her questions.  I discussed the follow-up plan and the likely surgical options if the need arises.  I did discuss the symptoms of worsening aortic stenosis with her and Denise Curtis knows to contact Dr. Irish Lack if these arise.  Plan:  Denise Curtis will continue to follow-up with her PCP and Dr. Irish Lack.  Denise Curtis will return to see me next March with a CTA of the chest.  I spent 60 minutes performing this consultation and > 50% of this time was spent face to face counseling and coordinating the care of this patient's prosthetic aortic valve stenosis  Gaye Pollack, MD [facility] [phone]

## 2020-06-24 DIAGNOSIS — I10 Essential (primary) hypertension: Secondary | ICD-10-CM | POA: Diagnosis not present

## 2020-06-24 DIAGNOSIS — Z1331 Encounter for screening for depression: Secondary | ICD-10-CM | POA: Diagnosis not present

## 2020-06-24 DIAGNOSIS — E1159 Type 2 diabetes mellitus with other circulatory complications: Secondary | ICD-10-CM | POA: Diagnosis not present

## 2020-06-24 DIAGNOSIS — Z1339 Encounter for screening examination for other mental health and behavioral disorders: Secondary | ICD-10-CM | POA: Diagnosis not present

## 2020-06-24 DIAGNOSIS — Z0000 Encounter for general adult medical examination without abnormal findings: Secondary | ICD-10-CM | POA: Diagnosis not present

## 2020-06-24 DIAGNOSIS — Z7901 Long term (current) use of anticoagulants: Secondary | ICD-10-CM | POA: Diagnosis not present

## 2020-06-24 DIAGNOSIS — I77810 Thoracic aortic ectasia: Secondary | ICD-10-CM | POA: Diagnosis not present

## 2020-06-24 DIAGNOSIS — I700 Atherosclerosis of aorta: Secondary | ICD-10-CM | POA: Diagnosis not present

## 2020-06-24 DIAGNOSIS — E785 Hyperlipidemia, unspecified: Secondary | ICD-10-CM | POA: Diagnosis not present

## 2020-06-24 DIAGNOSIS — Z952 Presence of prosthetic heart valve: Secondary | ICD-10-CM | POA: Diagnosis not present

## 2020-06-25 ENCOUNTER — Other Ambulatory Visit: Payer: Self-pay | Admitting: Internal Medicine

## 2020-06-25 DIAGNOSIS — Z13820 Encounter for screening for osteoporosis: Secondary | ICD-10-CM

## 2020-07-07 ENCOUNTER — Ambulatory Visit (INDEPENDENT_AMBULATORY_CARE_PROVIDER_SITE_OTHER): Payer: Medicare HMO | Admitting: Pharmacist

## 2020-07-07 DIAGNOSIS — Z7901 Long term (current) use of anticoagulants: Secondary | ICD-10-CM

## 2020-07-07 DIAGNOSIS — Z952 Presence of prosthetic heart valve: Secondary | ICD-10-CM | POA: Diagnosis not present

## 2020-07-07 LAB — POCT INR: INR: 2.4 (ref 2.0–3.0)

## 2020-07-07 NOTE — Patient Instructions (Signed)
Description   Called spoke with pt instructed her to take 2 tablets of warfarin today, then continue taking warfarin 1.5 tablets daily except for 2 tablets on Sundays. Recheck INR in 2 weeks. [facility] [phone].

## 2020-07-20 ENCOUNTER — Other Ambulatory Visit: Payer: Self-pay

## 2020-07-20 ENCOUNTER — Ambulatory Visit
Admission: RE | Admit: 2020-07-20 | Discharge: 2020-07-20 | Disposition: A | Discharge status: Home / Self Care | Payer: Medicare HMO | Source: Ambulatory Visit | Attending: Internal Medicine | Admitting: Internal Medicine

## 2020-07-20 DIAGNOSIS — Z1231 Encounter for screening mammogram for malignant neoplasm of breast: Secondary | ICD-10-CM | POA: Diagnosis not present

## 2020-07-21 ENCOUNTER — Ambulatory Visit (INDEPENDENT_AMBULATORY_CARE_PROVIDER_SITE_OTHER): Payer: Medicare HMO | Admitting: Interventional Cardiology

## 2020-07-21 DIAGNOSIS — Z7901 Long term (current) use of anticoagulants: Secondary | ICD-10-CM

## 2020-07-21 DIAGNOSIS — Z952 Presence of prosthetic heart valve: Secondary | ICD-10-CM | POA: Diagnosis not present

## 2020-07-21 LAB — POCT INR: INR: 3 (ref 2.0–3.0)

## 2020-07-21 NOTE — Patient Instructions (Signed)
Description   Called spoke with pt instructed her to continue taking warfarin 1.5 tablets daily except for 2 tablets on Sundays. Recheck INR in 2 weeks. Coumadin Clinic [phone].

## 2020-08-05 ENCOUNTER — Ambulatory Visit (INDEPENDENT_AMBULATORY_CARE_PROVIDER_SITE_OTHER): Payer: Medicare HMO | Admitting: Pharmacist

## 2020-08-05 DIAGNOSIS — Z952 Presence of prosthetic heart valve: Secondary | ICD-10-CM

## 2020-08-05 DIAGNOSIS — Z7901 Long term (current) use of anticoagulants: Secondary | ICD-10-CM | POA: Diagnosis not present

## 2020-08-05 LAB — POCT INR: INR: 2.4 (ref 2.0–3.0)

## 2020-08-05 NOTE — Patient Instructions (Signed)
Description   Called spoke with pt instructed her to take 2 tablets today, then continue taking warfarin 1.5 tablets daily except for 2 tablets on Sundays. Recheck INR in 2 weeks. [facility] [phone].

## 2020-08-10 DIAGNOSIS — R059 Cough, unspecified: Secondary | ICD-10-CM | POA: Diagnosis not present

## 2020-08-10 DIAGNOSIS — H60331 Swimmer's ear, right ear: Secondary | ICD-10-CM | POA: Diagnosis not present

## 2020-08-10 DIAGNOSIS — Z20822 Contact with and (suspected) exposure to covid-19: Secondary | ICD-10-CM | POA: Diagnosis not present

## 2020-08-10 DIAGNOSIS — J40 Bronchitis, not specified as acute or chronic: Secondary | ICD-10-CM | POA: Diagnosis not present

## 2020-08-10 DIAGNOSIS — R058 Other specified cough: Secondary | ICD-10-CM | POA: Diagnosis not present

## 2020-08-18 ENCOUNTER — Ambulatory Visit (INDEPENDENT_AMBULATORY_CARE_PROVIDER_SITE_OTHER): Payer: Medicare HMO | Admitting: *Deleted

## 2020-08-18 DIAGNOSIS — Z952 Presence of prosthetic heart valve: Secondary | ICD-10-CM | POA: Diagnosis not present

## 2020-08-18 DIAGNOSIS — Z7901 Long term (current) use of anticoagulants: Secondary | ICD-10-CM

## 2020-08-18 LAB — POCT INR: INR: 2.6 (ref 2.0–3.0)

## 2020-08-18 NOTE — Patient Instructions (Signed)
Description   Called spoke with pt instructed her to continue taking warfarin 1.5 tablets daily except for 2 tablets on Sundays. Recheck INR in 2 weeks. [facility] [phone].

## 2020-08-19 DIAGNOSIS — R5383 Other fatigue: Secondary | ICD-10-CM | POA: Diagnosis not present

## 2020-08-19 DIAGNOSIS — R0981 Nasal congestion: Secondary | ICD-10-CM | POA: Diagnosis not present

## 2020-08-19 DIAGNOSIS — J018 Other acute sinusitis: Secondary | ICD-10-CM | POA: Diagnosis not present

## 2020-08-19 DIAGNOSIS — Z1152 Encounter for screening for COVID-19: Secondary | ICD-10-CM | POA: Diagnosis not present

## 2020-08-19 DIAGNOSIS — R630 Anorexia: Secondary | ICD-10-CM | POA: Diagnosis not present

## 2020-08-19 DIAGNOSIS — R519 Headache, unspecified: Secondary | ICD-10-CM | POA: Diagnosis not present

## 2020-09-01 ENCOUNTER — Ambulatory Visit (INDEPENDENT_AMBULATORY_CARE_PROVIDER_SITE_OTHER): Payer: Medicare HMO | Admitting: *Deleted

## 2020-09-01 DIAGNOSIS — Z952 Presence of prosthetic heart valve: Secondary | ICD-10-CM | POA: Diagnosis not present

## 2020-09-01 DIAGNOSIS — Z7901 Long term (current) use of anticoagulants: Secondary | ICD-10-CM

## 2020-09-01 LAB — POCT INR: INR: 3.4 — AB (ref 2.0–3.0)

## 2020-09-01 NOTE — Patient Instructions (Signed)
Description   Called spoke with pt instructed her to continue taking warfarin 1.5 tablets daily except for 2 tablets on Sundays. Recheck INR in 2 weeks. [facility] [phone].

## 2020-09-09 DIAGNOSIS — Z124 Encounter for screening for malignant neoplasm of cervix: Secondary | ICD-10-CM | POA: Diagnosis not present

## 2020-09-09 DIAGNOSIS — Z1151 Encounter for screening for human papillomavirus (HPV): Secondary | ICD-10-CM | POA: Diagnosis not present

## 2020-09-09 DIAGNOSIS — Z01419 Encounter for gynecological examination (general) (routine) without abnormal findings: Secondary | ICD-10-CM | POA: Diagnosis not present

## 2020-09-09 DIAGNOSIS — Z6841 Body Mass Index (BMI) 40.0 and over, adult: Secondary | ICD-10-CM | POA: Diagnosis not present

## 2020-09-09 DIAGNOSIS — Z01411 Encounter for gynecological examination (general) (routine) with abnormal findings: Secondary | ICD-10-CM | POA: Diagnosis not present

## 2020-09-09 DIAGNOSIS — R69 Illness, unspecified: Secondary | ICD-10-CM | POA: Diagnosis not present

## 2020-09-15 ENCOUNTER — Ambulatory Visit (INDEPENDENT_AMBULATORY_CARE_PROVIDER_SITE_OTHER): Payer: Medicare HMO

## 2020-09-15 DIAGNOSIS — Z7901 Long term (current) use of anticoagulants: Secondary | ICD-10-CM | POA: Diagnosis not present

## 2020-09-15 DIAGNOSIS — Z5181 Encounter for therapeutic drug level monitoring: Secondary | ICD-10-CM

## 2020-09-15 DIAGNOSIS — Z952 Presence of prosthetic heart valve: Secondary | ICD-10-CM | POA: Diagnosis not present

## 2020-09-15 LAB — POCT INR: INR: 2.9 (ref 2.0–3.0)

## 2020-09-15 NOTE — Patient Instructions (Signed)
Description   Called spoke with pt instructed her to continue taking warfarin 1.5 tablets daily except for 2 tablets on Sundays. Recheck INR in 2 weeks. [facility] [phone].

## 2020-09-23 ENCOUNTER — Telehealth: Payer: Self-pay | Admitting: Interventional Cardiology

## 2020-09-23 NOTE — Telephone Encounter (Signed)
The patient states she was referred by Dr. Irish Lack to a Cardiothoracic Surgeon. The provider she saw was Dr. Arvid Right at [facility]. The patient got a bill that it was an out of network provider. The patient would need a formal referral sent to [facility] so that the claim can be covered.  [employer] states that Dr. Nyoka Lint Allred is the out of network Doctor.   The claim information is below :   Claim # [id number] Claim: $495  Claims by Mail:  [place] [address]  Phone: [phone]

## 2020-09-29 ENCOUNTER — Ambulatory Visit (INDEPENDENT_AMBULATORY_CARE_PROVIDER_SITE_OTHER): Payer: Medicare HMO

## 2020-09-29 DIAGNOSIS — Z952 Presence of prosthetic heart valve: Secondary | ICD-10-CM | POA: Diagnosis not present

## 2020-09-29 DIAGNOSIS — Z7901 Long term (current) use of anticoagulants: Secondary | ICD-10-CM

## 2020-09-29 LAB — POCT INR: INR: 3.4 — AB (ref 2.0–3.0)

## 2020-09-29 NOTE — Patient Instructions (Signed)
Description   Called spoke with pt instructed her to continue taking warfarin 1.5 tablets daily except for 2 tablets on Sundays. Recheck INR in 2 weeks. [facility] [phone].

## 2020-10-02 ENCOUNTER — Other Ambulatory Visit: Payer: Self-pay | Admitting: Family Medicine

## 2020-10-02 DIAGNOSIS — E119 Type 2 diabetes mellitus without complications: Secondary | ICD-10-CM

## 2020-10-10 ENCOUNTER — Other Ambulatory Visit: Payer: Self-pay | Admitting: Family Medicine

## 2020-10-10 DIAGNOSIS — E119 Type 2 diabetes mellitus without complications: Secondary | ICD-10-CM

## 2020-10-13 ENCOUNTER — Ambulatory Visit (INDEPENDENT_AMBULATORY_CARE_PROVIDER_SITE_OTHER): Payer: Medicare HMO | Admitting: *Deleted

## 2020-10-13 DIAGNOSIS — Z7901 Long term (current) use of anticoagulants: Secondary | ICD-10-CM | POA: Diagnosis not present

## 2020-10-13 DIAGNOSIS — Z952 Presence of prosthetic heart valve: Secondary | ICD-10-CM

## 2020-10-13 LAB — POCT INR: INR: 3.7 — AB (ref 2.0–3.0)

## 2020-10-13 NOTE — Patient Instructions (Signed)
Description   Called spoke with pt instructed pt to hold today's dose then continue taking warfarin 1.5 tablets daily except for 2 tablets on Sundays. Recheck INR in 2 weeks. [facility] [phone].

## 2020-10-14 NOTE — Telephone Encounter (Signed)
Patient no longer patient of Dr. Bebe Shaggy. New PCP as of August 2021

## 2020-10-27 ENCOUNTER — Ambulatory Visit (INDEPENDENT_AMBULATORY_CARE_PROVIDER_SITE_OTHER): Payer: Medicare HMO | Admitting: *Deleted

## 2020-10-27 DIAGNOSIS — Z7901 Long term (current) use of anticoagulants: Secondary | ICD-10-CM | POA: Diagnosis not present

## 2020-10-27 DIAGNOSIS — Z952 Presence of prosthetic heart valve: Secondary | ICD-10-CM

## 2020-10-27 LAB — POCT INR: INR: 3.1 — AB (ref 2.0–3.0)

## 2020-11-05 NOTE — Telephone Encounter (Signed)
Spoke with the patient who states that the order for her referral to Dr. Cyndia Bent needs to include the claim number in order for them to cover it.

## 2020-11-05 NOTE — Telephone Encounter (Signed)
Patient is following up. She is following up regarding her claim being covered.

## 2020-11-11 ENCOUNTER — Ambulatory Visit (INDEPENDENT_AMBULATORY_CARE_PROVIDER_SITE_OTHER): Payer: Medicare HMO

## 2020-11-11 DIAGNOSIS — Z952 Presence of prosthetic heart valve: Secondary | ICD-10-CM | POA: Diagnosis not present

## 2020-11-11 DIAGNOSIS — Z7901 Long term (current) use of anticoagulants: Secondary | ICD-10-CM | POA: Diagnosis not present

## 2020-11-11 DIAGNOSIS — Z5181 Encounter for therapeutic drug level monitoring: Secondary | ICD-10-CM

## 2020-11-11 LAB — POCT INR: INR: 3.1 — AB (ref 2.0–3.0)

## 2020-11-11 NOTE — Patient Instructions (Signed)
Description   Called spoke with pt instructed pt to continue taking warfarin 1.5 tablets daily except for 2 tablets on Sundays. Recheck INR in 2 weeks. [facility] [phone].

## 2020-11-22 DIAGNOSIS — D2239 Melanocytic nevi of other parts of face: Secondary | ICD-10-CM | POA: Diagnosis not present

## 2020-11-22 DIAGNOSIS — D225 Melanocytic nevi of trunk: Secondary | ICD-10-CM | POA: Diagnosis not present

## 2020-11-22 DIAGNOSIS — L814 Other melanin hyperpigmentation: Secondary | ICD-10-CM | POA: Diagnosis not present

## 2020-11-22 DIAGNOSIS — D1801 Hemangioma of skin and subcutaneous tissue: Secondary | ICD-10-CM | POA: Diagnosis not present

## 2020-11-24 ENCOUNTER — Ambulatory Visit (INDEPENDENT_AMBULATORY_CARE_PROVIDER_SITE_OTHER): Payer: Medicare HMO

## 2020-11-24 DIAGNOSIS — Z5181 Encounter for therapeutic drug level monitoring: Secondary | ICD-10-CM | POA: Diagnosis not present

## 2020-11-24 DIAGNOSIS — Z952 Presence of prosthetic heart valve: Secondary | ICD-10-CM | POA: Diagnosis not present

## 2020-11-24 LAB — POCT INR: INR: 3.3 — AB (ref 2.0–3.0)

## 2020-11-24 NOTE — Patient Instructions (Signed)
Description   Called spoke with pt instructed pt to continue taking warfarin 1.5 tablets daily except for 2 tablets on Sundays. Recheck INR in 2 weeks. [facility] [phone].

## 2020-12-08 ENCOUNTER — Ambulatory Visit (INDEPENDENT_AMBULATORY_CARE_PROVIDER_SITE_OTHER): Payer: Medicare HMO

## 2020-12-08 DIAGNOSIS — Z5181 Encounter for therapeutic drug level monitoring: Secondary | ICD-10-CM

## 2020-12-08 DIAGNOSIS — Z952 Presence of prosthetic heart valve: Secondary | ICD-10-CM

## 2020-12-08 LAB — POCT INR: INR: 4.4 — AB (ref 2.0–3.0)

## 2020-12-08 NOTE — Patient Instructions (Signed)
Description   Called spoke with pt instructed pt to Hold today's dose and then continue taking warfarin 1.5 tablets daily except for 2 tablets on Sundays. Recheck INR in 1 week. [facility] [phone].

## 2020-12-15 ENCOUNTER — Ambulatory Visit (INDEPENDENT_AMBULATORY_CARE_PROVIDER_SITE_OTHER): Payer: Medicare HMO

## 2020-12-15 DIAGNOSIS — Z952 Presence of prosthetic heart valve: Secondary | ICD-10-CM | POA: Diagnosis not present

## 2020-12-15 DIAGNOSIS — Z5181 Encounter for therapeutic drug level monitoring: Secondary | ICD-10-CM

## 2020-12-15 DIAGNOSIS — Z7901 Long term (current) use of anticoagulants: Secondary | ICD-10-CM

## 2020-12-15 LAB — POCT INR: INR: 3 (ref 2.0–3.0)

## 2020-12-15 NOTE — Patient Instructions (Signed)
Description   Called and spoke with pt. Instructed pt to continue taking warfarin 1.5 tablets daily except for 2 tablets on Sundays. Recheck INR in 2 weeks. [facility] [phone].

## 2020-12-28 DIAGNOSIS — Z7901 Long term (current) use of anticoagulants: Secondary | ICD-10-CM | POA: Diagnosis not present

## 2020-12-28 DIAGNOSIS — I700 Atherosclerosis of aorta: Secondary | ICD-10-CM | POA: Diagnosis not present

## 2020-12-28 DIAGNOSIS — Z23 Encounter for immunization: Secondary | ICD-10-CM | POA: Diagnosis not present

## 2020-12-28 DIAGNOSIS — I77810 Thoracic aortic ectasia: Secondary | ICD-10-CM | POA: Diagnosis not present

## 2020-12-28 DIAGNOSIS — E785 Hyperlipidemia, unspecified: Secondary | ICD-10-CM | POA: Diagnosis not present

## 2020-12-28 DIAGNOSIS — I10 Essential (primary) hypertension: Secondary | ICD-10-CM | POA: Diagnosis not present

## 2020-12-28 DIAGNOSIS — Z952 Presence of prosthetic heart valve: Secondary | ICD-10-CM | POA: Diagnosis not present

## 2020-12-28 DIAGNOSIS — E1159 Type 2 diabetes mellitus with other circulatory complications: Secondary | ICD-10-CM | POA: Diagnosis not present

## 2020-12-29 ENCOUNTER — Ambulatory Visit (INDEPENDENT_AMBULATORY_CARE_PROVIDER_SITE_OTHER): Payer: Medicare HMO | Admitting: Internal Medicine

## 2020-12-29 DIAGNOSIS — Z952 Presence of prosthetic heart valve: Secondary | ICD-10-CM | POA: Diagnosis not present

## 2020-12-29 DIAGNOSIS — Z7901 Long term (current) use of anticoagulants: Secondary | ICD-10-CM | POA: Diagnosis not present

## 2020-12-29 DIAGNOSIS — Z5181 Encounter for therapeutic drug level monitoring: Secondary | ICD-10-CM | POA: Diagnosis not present

## 2020-12-29 LAB — POCT INR: INR: 3.3 — AB (ref 2.0–3.0)

## 2020-12-29 NOTE — Patient Instructions (Signed)
Description   Called and spoke with pt. Instructed pt to continue taking warfarin 1.5 tablets daily except for 2 tablets on Sundays. Recheck INR in 2 weeks. [facility] [phone].

## 2020-12-31 ENCOUNTER — Other Ambulatory Visit: Payer: Medicare HMO

## 2021-01-12 ENCOUNTER — Ambulatory Visit (INDEPENDENT_AMBULATORY_CARE_PROVIDER_SITE_OTHER): Payer: Medicare HMO

## 2021-01-12 DIAGNOSIS — Z952 Presence of prosthetic heart valve: Secondary | ICD-10-CM | POA: Diagnosis not present

## 2021-01-12 DIAGNOSIS — Z5181 Encounter for therapeutic drug level monitoring: Secondary | ICD-10-CM | POA: Diagnosis not present

## 2021-01-12 LAB — POCT INR: INR: 3.3 — AB (ref 2.0–3.0)

## 2021-01-12 NOTE — Patient Instructions (Signed)
Description   Called and spoke with pt. Instructed pt to continue taking warfarin 1.5 tablets daily except for 2 tablets on Sundays. Recheck INR in 2 weeks. [facility] [phone].

## 2021-01-26 ENCOUNTER — Ambulatory Visit (INDEPENDENT_AMBULATORY_CARE_PROVIDER_SITE_OTHER): Payer: Medicare HMO

## 2021-01-26 DIAGNOSIS — Z7901 Long term (current) use of anticoagulants: Secondary | ICD-10-CM

## 2021-01-26 DIAGNOSIS — Z952 Presence of prosthetic heart valve: Secondary | ICD-10-CM

## 2021-01-26 DIAGNOSIS — Z5181 Encounter for therapeutic drug level monitoring: Secondary | ICD-10-CM | POA: Diagnosis not present

## 2021-01-26 LAB — POCT INR: INR: 3 (ref 2.0–3.0)

## 2021-01-26 NOTE — Patient Instructions (Signed)
Description   Called and spoke with pt. Instructed pt to continue taking warfarin 1.5 tablets daily except for 2 tablets on Sundays. Recheck INR in 2 weeks. [facility] [phone].

## 2021-02-09 ENCOUNTER — Ambulatory Visit (INDEPENDENT_AMBULATORY_CARE_PROVIDER_SITE_OTHER): Payer: Medicare HMO

## 2021-02-09 DIAGNOSIS — Z952 Presence of prosthetic heart valve: Secondary | ICD-10-CM

## 2021-02-09 DIAGNOSIS — Z7901 Long term (current) use of anticoagulants: Secondary | ICD-10-CM

## 2021-02-09 DIAGNOSIS — Z5181 Encounter for therapeutic drug level monitoring: Secondary | ICD-10-CM | POA: Diagnosis not present

## 2021-02-09 LAB — POCT INR: INR: 3.3 — AB (ref 2.0–3.0)

## 2021-02-09 NOTE — Patient Instructions (Signed)
Description   Called and spoke with pt. Instructed pt to continue taking warfarin 1.5 tablets daily except for 2 tablets on Sundays. Recheck INR in 2 weeks. [facility] [phone].

## 2021-02-23 ENCOUNTER — Ambulatory Visit (INDEPENDENT_AMBULATORY_CARE_PROVIDER_SITE_OTHER): Payer: Medicare HMO

## 2021-02-23 DIAGNOSIS — Z7901 Long term (current) use of anticoagulants: Secondary | ICD-10-CM

## 2021-02-23 DIAGNOSIS — Z952 Presence of prosthetic heart valve: Secondary | ICD-10-CM | POA: Diagnosis not present

## 2021-02-23 LAB — POCT INR: INR: 2 (ref 2.0–3.0)

## 2021-02-23 NOTE — Patient Instructions (Signed)
Description   Called and spoke with pt. Instructed pt to take 2 tablets today, then resume same dosage of warfarin 1.5 tablets daily except for 2 tablets on Sundays. Recheck INR in 1 week since pt has the flu at present (usually checks every 2 weeks). [facility] [phone].

## 2021-03-02 ENCOUNTER — Ambulatory Visit (INDEPENDENT_AMBULATORY_CARE_PROVIDER_SITE_OTHER): Payer: Medicare HMO | Admitting: *Deleted

## 2021-03-02 ENCOUNTER — Ambulatory Visit (HOSPITAL_COMMUNITY): Payer: Medicare HMO | Attending: Interventional Cardiology

## 2021-03-02 ENCOUNTER — Other Ambulatory Visit: Payer: Self-pay

## 2021-03-02 DIAGNOSIS — I77810 Thoracic aortic ectasia: Secondary | ICD-10-CM | POA: Diagnosis not present

## 2021-03-02 DIAGNOSIS — Z7901 Long term (current) use of anticoagulants: Secondary | ICD-10-CM

## 2021-03-02 DIAGNOSIS — Z952 Presence of prosthetic heart valve: Secondary | ICD-10-CM | POA: Insufficient documentation

## 2021-03-02 LAB — ECHOCARDIOGRAM COMPLETE
AR max vel: 0.64 cm2
AV Area VTI: 0.72 cm2
AV Area mean vel: 0.63 cm2
AV Mean grad: 33 mmHg
AV Peak grad: 51.6 mmHg
Ao pk vel: 3.59 m/s
Area-P 1/2: 4.52 cm2
S' Lateral: 3.4 cm

## 2021-03-02 LAB — POCT INR: INR: 4.7 — AB (ref 2.0–3.0)

## 2021-03-02 NOTE — Patient Instructions (Signed)
Description   Called and spoke with pt. Instructed pt to hold today's dose and take 1 tablet tomorrow then resume taking warfarin 1.5 tablets daily except for 2 tablets on Sundays. Recheck INR in 1 week (usually checks every 2 weeks). [facility] [phone].

## 2021-03-09 ENCOUNTER — Telehealth: Payer: Self-pay | Admitting: *Deleted

## 2021-03-09 ENCOUNTER — Ambulatory Visit (INDEPENDENT_AMBULATORY_CARE_PROVIDER_SITE_OTHER): Payer: Medicare HMO | Admitting: *Deleted

## 2021-03-09 DIAGNOSIS — I77810 Thoracic aortic ectasia: Secondary | ICD-10-CM

## 2021-03-09 DIAGNOSIS — Z7901 Long term (current) use of anticoagulants: Secondary | ICD-10-CM | POA: Diagnosis not present

## 2021-03-09 DIAGNOSIS — Z952 Presence of prosthetic heart valve: Secondary | ICD-10-CM

## 2021-03-09 LAB — POCT INR: INR: 2.2 (ref 2.0–3.0)

## 2021-03-09 NOTE — Patient Instructions (Addendum)
Description   Called and spoke with pt. Instructed pt to take 2 tablets today then continue taking warfarin 1.5 tablets daily except for 2 tablets on Sundays. Recheck INR in 2 weeks (self tester-2 weeks). [facility] [phone].

## 2021-03-09 NOTE — Telephone Encounter (Signed)
I spoke with patient and reviewed results with her. She is due to see Dr Cyndia Bent back in March. Patient states her insurance requires a referral.  Referral placed.

## 2021-03-09 NOTE — Telephone Encounter (Signed)
-----   Message from Jettie Booze, MD sent at 03/04/2021  2:06 PM EST ----- Normal LV function. Dilated aorta.  Mildly decreased RV function.  COntinue to monitor or sx with exercise.  COntinue with plans for f/u with Dr. Cyndia Bent in March.

## 2021-03-10 ENCOUNTER — Other Ambulatory Visit: Payer: Self-pay | Admitting: *Deleted

## 2021-03-10 DIAGNOSIS — I350 Nonrheumatic aortic (valve) stenosis: Secondary | ICD-10-CM

## 2021-03-23 ENCOUNTER — Ambulatory Visit (INDEPENDENT_AMBULATORY_CARE_PROVIDER_SITE_OTHER): Payer: Medicare HMO | Admitting: *Deleted

## 2021-03-23 DIAGNOSIS — Z7901 Long term (current) use of anticoagulants: Secondary | ICD-10-CM | POA: Diagnosis not present

## 2021-03-23 DIAGNOSIS — Z952 Presence of prosthetic heart valve: Secondary | ICD-10-CM

## 2021-03-23 LAB — POCT INR: INR: 3 (ref 2.0–3.0)

## 2021-03-23 NOTE — Patient Instructions (Signed)
Description   Called and spoke with pt & instructed pt to continue taking warfarin 1.5 tablets daily except for 2 tablets on Sundays. Recheck INR in 2 weeks (self tester-2 weeks). [facility] [phone].

## 2021-04-06 ENCOUNTER — Ambulatory Visit (INDEPENDENT_AMBULATORY_CARE_PROVIDER_SITE_OTHER): Payer: Medicare HMO

## 2021-04-06 DIAGNOSIS — Z5181 Encounter for therapeutic drug level monitoring: Secondary | ICD-10-CM

## 2021-04-06 DIAGNOSIS — Z952 Presence of prosthetic heart valve: Secondary | ICD-10-CM

## 2021-04-06 LAB — POCT INR: INR: 3 (ref 2.0–3.0)

## 2021-04-06 NOTE — Patient Instructions (Signed)
Description   Called and spoke with pt & instructed pt to continue taking warfarin 1.5 tablets daily except for 2 tablets on Sundays. Recheck INR in 2 weeks (self tester-2 weeks). [facility] [phone].

## 2021-04-20 ENCOUNTER — Ambulatory Visit (INDEPENDENT_AMBULATORY_CARE_PROVIDER_SITE_OTHER): Payer: Medicare HMO | Admitting: Interventional Cardiology

## 2021-04-20 DIAGNOSIS — Z5181 Encounter for therapeutic drug level monitoring: Secondary | ICD-10-CM | POA: Diagnosis not present

## 2021-04-20 LAB — POCT INR: INR: 3 (ref 2.0–3.0)

## 2021-04-20 NOTE — Patient Instructions (Signed)
Description   ?Called and spoke with pt & instructed pt to continue taking warfarin 1.5 tablets daily except for 2 tablets on Sundays. Recheck INR in 2 weeks (self tester-2 weeks). [facility] [phone].  ?  ? ? ?

## 2021-04-26 ENCOUNTER — Encounter: Payer: Self-pay | Admitting: Interventional Cardiology

## 2021-04-26 NOTE — Telephone Encounter (Signed)
Error

## 2021-04-27 ENCOUNTER — Other Ambulatory Visit: Payer: Medicare HMO

## 2021-04-27 ENCOUNTER — Ambulatory Visit: Payer: Medicare HMO | Admitting: Surgery

## 2021-04-27 ENCOUNTER — Inpatient Hospital Stay: Admission: RE | Admit: 2021-04-27 | Payer: Medicare HMO | Source: Ambulatory Visit

## 2021-05-04 ENCOUNTER — Ambulatory Visit (INDEPENDENT_AMBULATORY_CARE_PROVIDER_SITE_OTHER): Payer: Medicare HMO | Admitting: *Deleted

## 2021-05-04 DIAGNOSIS — Z7901 Long term (current) use of anticoagulants: Secondary | ICD-10-CM

## 2021-05-04 DIAGNOSIS — Z952 Presence of prosthetic heart valve: Secondary | ICD-10-CM

## 2021-05-04 LAB — POCT INR: INR: 3 (ref 2.0–3.0)

## 2021-05-18 ENCOUNTER — Ambulatory Visit (INDEPENDENT_AMBULATORY_CARE_PROVIDER_SITE_OTHER): Payer: Medicare HMO | Admitting: Cardiology

## 2021-05-18 DIAGNOSIS — Z5181 Encounter for therapeutic drug level monitoring: Secondary | ICD-10-CM

## 2021-05-18 LAB — POCT INR: INR: 3.2 — AB (ref 2.0–3.0)

## 2021-05-18 NOTE — Patient Instructions (Signed)
Description   ?Called and spoke with pt & instructed pt to continue taking warfarin 1.5 tablets daily except for 2 tablets on Sundays. Recheck INR in 2 weeks (self tester-2 weeks). [facility] [phone].  ?  ? ? ?

## 2021-06-01 ENCOUNTER — Ambulatory Visit (INDEPENDENT_AMBULATORY_CARE_PROVIDER_SITE_OTHER): Payer: Medicare HMO

## 2021-06-01 DIAGNOSIS — Z7901 Long term (current) use of anticoagulants: Secondary | ICD-10-CM | POA: Diagnosis not present

## 2021-06-01 DIAGNOSIS — Z952 Presence of prosthetic heart valve: Secondary | ICD-10-CM

## 2021-06-01 LAB — POCT INR: INR: 4 — AB (ref 2.0–3.0)

## 2021-06-01 NOTE — Patient Instructions (Signed)
Description   ?Called and spoke with pt & instructed pt to skip today's dosage of Warfarin, then resume same dosage 1.5 tablets daily except for 2 tablets on Sundays. Recheck INR in 2 weeks (self tester-2 weeks). [facility] [phone].  ?  ?  ?

## 2021-06-02 ENCOUNTER — Ambulatory Visit
Admission: RE | Admit: 2021-06-02 | Discharge: 2021-06-02 | Disposition: A | Discharge status: Home / Self Care | Payer: Medicare HMO | Source: Ambulatory Visit | Attending: Internal Medicine | Admitting: Internal Medicine

## 2021-06-02 ENCOUNTER — Other Ambulatory Visit: Payer: Self-pay | Admitting: Internal Medicine

## 2021-06-02 DIAGNOSIS — Z1231 Encounter for screening mammogram for malignant neoplasm of breast: Secondary | ICD-10-CM

## 2021-06-02 DIAGNOSIS — M85852 Other specified disorders of bone density and structure, left thigh: Secondary | ICD-10-CM | POA: Diagnosis not present

## 2021-06-02 DIAGNOSIS — Z780 Asymptomatic menopausal state: Secondary | ICD-10-CM | POA: Diagnosis not present

## 2021-06-02 DIAGNOSIS — Z13820 Encounter for screening for osteoporosis: Secondary | ICD-10-CM

## 2021-06-15 ENCOUNTER — Ambulatory Visit (INDEPENDENT_AMBULATORY_CARE_PROVIDER_SITE_OTHER): Payer: Medicare HMO | Admitting: *Deleted

## 2021-06-15 DIAGNOSIS — Z7901 Long term (current) use of anticoagulants: Secondary | ICD-10-CM

## 2021-06-15 DIAGNOSIS — Z952 Presence of prosthetic heart valve: Secondary | ICD-10-CM | POA: Diagnosis not present

## 2021-06-15 LAB — POCT INR: INR: 2.8 (ref 2.0–3.0)

## 2021-06-15 NOTE — Patient Instructions (Signed)
Description   ?Called and spoke with pt & instructed pt to continue taking 1.5 tablets daily except for 2 tablets on Sundays. Recheck INR in 2 weeks (self tester-2 weeks). [facility] [phone].  ?  ?  ?

## 2021-06-21 DIAGNOSIS — E785 Hyperlipidemia, unspecified: Secondary | ICD-10-CM | POA: Diagnosis not present

## 2021-06-21 DIAGNOSIS — M859 Disorder of bone density and structure, unspecified: Secondary | ICD-10-CM | POA: Diagnosis not present

## 2021-06-21 DIAGNOSIS — E1159 Type 2 diabetes mellitus with other circulatory complications: Secondary | ICD-10-CM | POA: Diagnosis not present

## 2021-06-21 DIAGNOSIS — I10 Essential (primary) hypertension: Secondary | ICD-10-CM | POA: Diagnosis not present

## 2021-06-21 NOTE — Progress Notes (Signed)
?  ?Cardiology Office Note ? ? ?Date:  06/22/2021  ? ?ID:  Denise Curtis, DOB 1955/04/07, MRN [id number] ? ?PCP:  Michael Boston, MD  ? ? ?No chief complaint on file. ? ?S/p AVR ? ?Wt Readings from Last 3 Encounters:  ?06/22/21 252 lb 3.2 oz (114.4 kg)  ?06/23/20 250 lb (113.4 kg)  ?06/03/20 250 lb (113.4 kg)  ?  ? ?  ?History of Present Illness: ?Denise Curtis is a 66 y.o. female  who is being seen today for the evaluation of valvular heart disease at the request of Libby Maw,*. ?  ?She saw me to establish with a cardiologist after valve replacement surgery.  She moved to [place] from [place] in 9/19.  Her husband's job brought her here. ?  ?It was a [employer] aortic valve: Serial number: [id number]; Model # 21 [id number]; placed in Mar 05 2014; Dr. Owens Shark at [facility].  ?  ?Done for bicuspid aortic valve.  Per her report, cath was clean prior to surgery.   ?  ?She has been following a range of 2.5 - 3.5.   She has a home machine. ? echo showed increased aortic vlave velocity.  CTA was done showing: "There is a high attenuation layer (HU >145) extending from the sewing ring to the leaflet base consistent with pannus. There is no thrombus present."   ?  ?Husband had severe COVID in 12/21 along with influenza. ?  ?She had COVID in 2/22.  Got it from her granddaughter.  It was a mild case.  Whole family was vaccinated.  ?  ? ?She did see Dr. Cyndia Bent: "  I think if she requires redo aortic valve replacement she would likely require aortic root replacement with a porcine root because I doubt a 21 mm valve could be reinserted.  Then the remainder of her aorta up to the innominate artery would need to be replaced.  This would be a major high risk surgery for this patient but I suspect it will need to be done at some point.  I think as long she has normal left ventricular function without worsening LVH and is asymptomatic it is reasonable to continue following this closely for a while.  She is  scheduled for a follow-up echocardiogram in January 2023.  I will plan to repeat her CTA of the chest in March 2023 to reevaluate the thoracic aorta in entirety." ? ?Son was in bike accident today so she is stressed.  ? ?She did not have her chest CT scan as of yet- will be on June 28.   ? ?Denies : Chest pain. Dizziness. Leg edema. Nitroglycerin use. Orthopnea. Palpitations. Paroxysmal nocturnal dyspnea. Shortness of breath. Syncope.   ? ?Watches her 31 y/o granddaughter.  Gained weight.  Got down to 235 lbs.   ? ?Past Medical History:  ?Diagnosis Date  ? 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 Diabetes mellitus type II, non insulin dependent ([place])   ? Heart murmur   ? Hyperlipidemia   ? Hypertension   ? Migraines   ? ? ?Past Surgical History:  ?Procedure Laterality Date  ? APPENDECTOMY  2005  ? COLONOSCOPY    ? MECHANICAL AORTIC VALVE REPLACEMENT  2016  ? ? ? ?Current Outpatient Medications  ?Medication Sig Dispense Refill  ? albuterol (PROVENTIL HFA;VENTOLIN HFA) 108 (90 Base) MCG/ACT inhaler Inhale 1-2 puffs into the lungs every 6 (six) hours as needed for wheezing. 1 Inhaler 0  ? atorvastatin (  LIPITOR) 40 MG tablet Take 1 tablet (40 mg total) by mouth daily. 90 tablet 3  ? clindamycin (CLEOCIN) 300 MG capsule Take 300 mg by mouth 2 (two) times daily. Prior to dental procedure    ? JARDIANCE 25 MG TABS tablet TAKE 1 TABLET BY MOUTH EVERY DAY 30 tablet 1  ? lisinopril (ZESTRIL) 10 MG tablet Take 10 mg by mouth daily.    ? metoprolol succinate (TOPROL-XL) 25 MG 24 hr tablet Take 1 tablet (25 mg total) by mouth daily. 90 tablet 0  ? metoprolol tartrate (LOPRESSOR) 50 MG tablet Take 1 tablet 2 hours prior to CT Scan. 1 tablet 0  ? Semaglutide (OZEMPIC, 0.25 OR 0.5 MG/DOSE, [place]) Inject 0.5 mg into the skin. Once weekly    ? warfarin (COUMADIN) 5 MG tablet TAKE AS DIRECTED. 60 DAY SUPPLY 150 tablet 1  ? ?No current facility-administered medications for this visit.  ? ? ?Allergies:   Metformin and related, Hydrochlorothiazide, and  Penicillins  ? ? ?Social History:  The patient  reports that she has never smoked. She has never used smokeless tobacco. She reports current alcohol use. She reports that she does not use drugs.  ? ?Family History:  The patient's family history includes COPD in her father and mother; Heart disease in her mother; Squamous cell carcinoma (age of onset: 7) in her mother; Stroke in her paternal grandfather.  ? ? ?ROS:  Please see the history of present illness.   Otherwise, review of systems are positive for weight gain.   All other systems are reviewed and negative.  ? ? ?PHYSICAL EXAM: ?VS:  BP (!) 158/80   Pulse 82   Ht '5\' 1"'$  (1.549 m)   Wt 252 lb 3.2 oz (114.4 kg)   SpO2 98%   BMI 47.65 kg/m?  , BMI Body mass index is 47.65 kg/m?. ?GEN: Well nourished, well developed, in no acute distress ?HEENT: normal ?Neck: no JVD, carotid bruits, or masses ?Cardiac: RRR; crisp S2 click; 2/6 systolic murmurs; no rubs, or gallops,no edema  ?Respiratory:  clear to auscultation bilaterally, normal work of breathing ?GI: soft, nontender, nondistended, + BS, obese ?MS: no deformity or atrophy ?Skin: warm and dry, no rash ?Neuro:  Strength and sensation are intact ?Psych: euthymic mood, full affect ? ? ?EKG:   ?The ekg ordered today demonstrates normal sinus rhythm, poor R wave progression, no significant ST changes ? ? ?Recent Labs: ?No results found for requested labs within last 8760 hours.  ? ?Lipid Panel ?   ?Component Value Date/Time  ? CHOL 156 03/04/2018 0956  ? TRIG 177.0 (H) 03/04/2018 0956  ? HDL 39.00 (L) 03/04/2018 0956  ? CHOLHDL 4 03/04/2018 0956  ? VLDL 35.4 03/04/2018 0956  ? [place] 81 03/04/2018 0956  ? LDLDIRECT 95.0 03/04/2018 0956  ? ?  ?Other studies Reviewed: ?Additional studies/ records that were reviewed today with results demonstrating: labs and echo reviewed. ? ? ?ASSESSMENT AND PLAN: ? 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  She has not had sx of CHF.  Continue to watch for volume overload, angina,  lightheadedness or syncope. ?DM: A1C 6.1 in 12/2020.  Healthy diet recommended. ?Hyperlpidemia:  Whole food. Plant based diet.  Continue atorvastatin. LDL 61 in 06/2020.  She will have blood drawn with her primary care doctor. ?Morbid obesity: healthy diet discussed. SHe has struggled with weight for years with weight going up and down.   ?Anticoagulated: Checks COumadin at home.  No bleeding problems.  Values have been  well controlled. ? ? ?Current medicines are reviewed at length with the patient today.  The patient concerns regarding her medicines were addressed. ? ?The following changes have been made:  No change ? ?Labs/ tests ordered today include:  ? ?Orders Placed This Encounter  ?Procedures  ? EKG 12-Lead  ? ECHOCARDIOGRAM COMPLETE  ? ? ?Recommend 150 minutes/week of aerobic exercise ?Low fat, low carb, high fiber diet recommended ? ?Disposition:   FU in 1 year ? ? ?Signed, ?Larae Grooms, MD  ?06/22/2021 11:02 AM    ?[facility] ?[address], [place], [place]  [address] ?Phone: [phone]; Fax: [phone]  ? ?

## 2021-06-22 ENCOUNTER — Encounter: Payer: Self-pay | Admitting: Interventional Cardiology

## 2021-06-22 ENCOUNTER — Ambulatory Visit: Payer: Medicare HMO | Admitting: Interventional Cardiology

## 2021-06-22 VITALS — BP 158/80 | HR 82 | Ht 61.0 in | Wt 252.2 lb

## 2021-06-22 DIAGNOSIS — E119 Type 2 diabetes mellitus without complications: Secondary | ICD-10-CM | POA: Diagnosis not present

## 2021-06-22 DIAGNOSIS — I77810 Thoracic aortic ectasia: Secondary | ICD-10-CM | POA: Diagnosis not present

## 2021-06-22 DIAGNOSIS — E782 Mixed hyperlipidemia: Secondary | ICD-10-CM | POA: Diagnosis not present

## 2021-06-22 DIAGNOSIS — Z952 Presence of prosthetic heart valve: Secondary | ICD-10-CM

## 2021-06-22 NOTE — Patient Instructions (Signed)
Medication Instructions:  ?Your physician recommends that you continue on your current medications as directed. Please refer to the Current Medication list given to you today. ? ?*If you need a refill on your cardiac medications before your next appointment, please call your pharmacy* ? ? ?Lab Work: ?none ?If you have labs (blood work) drawn today and your tests are completely normal, you will receive your results only by: ?MyChart Message (if you have MyChart) OR ?A paper copy in the mail ?If you have any lab test that is abnormal or we need to change your treatment, we will call you to review the results. ? ? ?Testing/Procedures: ?Your physician has requested that you have an echocardiogram. Echocardiography is a painless test that uses sound waves to create images of your heart. It provides your doctor with information about the size and shape of your heart and how well your heart?s chambers and valves are working. This procedure takes approximately one hour. There are no restrictions for this procedure. ?To be done in January 2024 ? ? ?Follow-Up: ?At [facility], you and your health needs are our priority.  As part of our continuing mission to provide you with exceptional heart care, we have created designated Provider Care Teams.  These Care Teams include your primary Cardiologist (physician) and Advanced Practice Providers (APPs -  Physician Assistants and Nurse Practitioners) who all work together to provide you with the care you need, when you need it. ? ?We recommend signing up for the patient portal called "MyChart".  Sign up information is provided on this After Visit Summary.  MyChart is used to connect with patients for Virtual Visits (Telemedicine).  Patients are able to view lab/test results, encounter notes, upcoming appointments, etc.  Non-urgent messages can be sent to your provider as well.   ?To learn more about what you can do with MyChart, go to [email or web address].   ? ?Your next  appointment:   ?12 month(s) ? ?The format for your next appointment:   ?In Person ? ?Provider:   ?Larae Grooms, MD   ? ? ?Other Instructions ?  ? ?Important Information About Sugar ? ? ? ? ? ? ?

## 2021-06-28 DIAGNOSIS — Z23 Encounter for immunization: Secondary | ICD-10-CM | POA: Diagnosis not present

## 2021-06-28 DIAGNOSIS — I77810 Thoracic aortic ectasia: Secondary | ICD-10-CM | POA: Diagnosis not present

## 2021-06-28 DIAGNOSIS — E785 Hyperlipidemia, unspecified: Secondary | ICD-10-CM | POA: Diagnosis not present

## 2021-06-28 DIAGNOSIS — M8589 Other specified disorders of bone density and structure, multiple sites: Secondary | ICD-10-CM | POA: Diagnosis not present

## 2021-06-28 DIAGNOSIS — I10 Essential (primary) hypertension: Secondary | ICD-10-CM | POA: Diagnosis not present

## 2021-06-28 DIAGNOSIS — E1159 Type 2 diabetes mellitus with other circulatory complications: Secondary | ICD-10-CM | POA: Diagnosis not present

## 2021-06-28 DIAGNOSIS — Z952 Presence of prosthetic heart valve: Secondary | ICD-10-CM | POA: Diagnosis not present

## 2021-06-28 DIAGNOSIS — Z7901 Long term (current) use of anticoagulants: Secondary | ICD-10-CM | POA: Diagnosis not present

## 2021-06-28 DIAGNOSIS — Z1331 Encounter for screening for depression: Secondary | ICD-10-CM | POA: Diagnosis not present

## 2021-06-28 DIAGNOSIS — I700 Atherosclerosis of aorta: Secondary | ICD-10-CM | POA: Diagnosis not present

## 2021-06-28 DIAGNOSIS — Z0000 Encounter for general adult medical examination without abnormal findings: Secondary | ICD-10-CM | POA: Diagnosis not present

## 2021-06-28 DIAGNOSIS — Z1339 Encounter for screening examination for other mental health and behavioral disorders: Secondary | ICD-10-CM | POA: Diagnosis not present

## 2021-06-29 ENCOUNTER — Ambulatory Visit (INDEPENDENT_AMBULATORY_CARE_PROVIDER_SITE_OTHER): Payer: Medicare HMO

## 2021-06-29 DIAGNOSIS — Z952 Presence of prosthetic heart valve: Secondary | ICD-10-CM | POA: Diagnosis not present

## 2021-06-29 DIAGNOSIS — Z7901 Long term (current) use of anticoagulants: Secondary | ICD-10-CM

## 2021-06-29 LAB — POCT INR: INR: 2.9 (ref 2.0–3.0)

## 2021-06-29 NOTE — Patient Instructions (Signed)
Description   ?Called and spoke with pt & instructed pt to continue taking 1.5 tablets daily except for 2 tablets on Sundays.  ?Recheck INR in 2 weeks (self tester-2 weeks).  ?[facility] [phone].  ?  ?   ?

## 2021-07-13 ENCOUNTER — Ambulatory Visit (INDEPENDENT_AMBULATORY_CARE_PROVIDER_SITE_OTHER): Payer: Medicare HMO

## 2021-07-13 DIAGNOSIS — Z952 Presence of prosthetic heart valve: Secondary | ICD-10-CM

## 2021-07-13 DIAGNOSIS — Z7901 Long term (current) use of anticoagulants: Secondary | ICD-10-CM | POA: Diagnosis not present

## 2021-07-13 LAB — POCT INR: INR: 2.6 (ref 2.0–3.0)

## 2021-07-13 NOTE — Patient Instructions (Signed)
Description   Called and spoke with pt & instructed pt to continue taking 1.5 tablets daily except for 2 tablets on Sundays.  Recheck INR in 2 weeks (self tester-2 weeks).  [facility] [phone].

## 2021-07-27 ENCOUNTER — Ambulatory Visit (INDEPENDENT_AMBULATORY_CARE_PROVIDER_SITE_OTHER): Payer: Medicare HMO | Admitting: *Deleted

## 2021-07-27 DIAGNOSIS — Z7901 Long term (current) use of anticoagulants: Secondary | ICD-10-CM

## 2021-07-27 DIAGNOSIS — Z952 Presence of prosthetic heart valve: Secondary | ICD-10-CM | POA: Diagnosis not present

## 2021-07-27 LAB — POCT INR: INR: 3 (ref 2.0–3.0)

## 2021-07-27 NOTE — Patient Instructions (Signed)
Description   Called and spoke with pt & instructed pt to continue taking 1.5 tablets daily except for 2 tablets on Sundays. Recheck INR in 2 weeks (self tester-2 weeks). [facility] [phone].

## 2021-08-08 ENCOUNTER — Ambulatory Visit: Payer: Medicare HMO

## 2021-08-09 ENCOUNTER — Ambulatory Visit: Payer: Medicare HMO

## 2021-08-10 ENCOUNTER — Ambulatory Visit (INDEPENDENT_AMBULATORY_CARE_PROVIDER_SITE_OTHER): Payer: Medicare HMO | Admitting: *Deleted

## 2021-08-10 DIAGNOSIS — Z952 Presence of prosthetic heart valve: Secondary | ICD-10-CM | POA: Diagnosis not present

## 2021-08-10 DIAGNOSIS — Z7901 Long term (current) use of anticoagulants: Secondary | ICD-10-CM

## 2021-08-10 LAB — POCT INR: INR: 3.7 — AB (ref 2.0–3.0)

## 2021-08-10 NOTE — Patient Instructions (Signed)
Description   Called and spoke with pt & instructed pt to take 1 tablet then continue taking 1.5 tablets daily except for 2 tablets on Sundays. Recheck INR in 2 weeks (self tester-2 weeks). [facility] [phone].

## 2021-08-12 ENCOUNTER — Other Ambulatory Visit: Payer: Self-pay | Admitting: Interventional Cardiology

## 2021-08-12 DIAGNOSIS — E1169 Type 2 diabetes mellitus with other specified complication: Secondary | ICD-10-CM

## 2021-08-17 ENCOUNTER — Encounter: Payer: Self-pay | Admitting: Surgery

## 2021-08-17 ENCOUNTER — Other Ambulatory Visit: Payer: Self-pay | Admitting: Surgery

## 2021-08-17 ENCOUNTER — Ambulatory Visit
Admission: RE | Admit: 2021-08-17 | Discharge: 2021-08-17 | Disposition: A | Discharge status: Home / Self Care | Payer: Medicare HMO | Source: Ambulatory Visit | Attending: Surgery | Admitting: Surgery

## 2021-08-17 ENCOUNTER — Ambulatory Visit: Payer: Medicare HMO | Admitting: Surgery

## 2021-08-17 VITALS — BP 155/67 | HR 72 | Resp 20 | Ht 61.0 in | Wt 245.0 lb

## 2021-08-17 DIAGNOSIS — E041 Nontoxic single thyroid nodule: Secondary | ICD-10-CM

## 2021-08-17 DIAGNOSIS — I7121 Aneurysm of the ascending aorta, without rupture: Secondary | ICD-10-CM | POA: Diagnosis not present

## 2021-08-17 DIAGNOSIS — I350 Nonrheumatic aortic (valve) stenosis: Secondary | ICD-10-CM

## 2021-08-17 DIAGNOSIS — I2510 Atherosclerotic heart disease of native coronary artery without angina pectoris: Secondary | ICD-10-CM | POA: Diagnosis not present

## 2021-08-17 DIAGNOSIS — J984 Other disorders of lung: Secondary | ICD-10-CM | POA: Diagnosis not present

## 2021-08-17 DIAGNOSIS — I712 Thoracic aortic aneurysm, without rupture, unspecified: Secondary | ICD-10-CM | POA: Diagnosis not present

## 2021-08-17 DIAGNOSIS — R918 Other nonspecific abnormal finding of lung field: Secondary | ICD-10-CM | POA: Diagnosis not present

## 2021-08-17 MED ORDER — IOPAMIDOL (ISOVUE-370) INJECTION 76%
75.0000 mL | Freq: Once | INTRAVENOUS | Status: AC | PRN
  Administered 2021-08-17: 75 mL via INTRAVENOUS

## 2021-08-17 NOTE — Progress Notes (Signed)
HPI:  The patient is a 66 year old woman with a history of hypertension, hyperlipidemia, type II diabetes, morbid obesity, and degenerative joint disease of the knees who underwent aortic valve replacement in 2016 in [place] at [facility] for bicuspid aortic valve stenosis.  She said that her cardiac catheterization preoperatively was normal and she was never told about any problem with her aorta.  She was very symptomatic prior to her surgery and immediately improved and was back to normal activity.  She said that she had an echocardiogram for follow-up a few years later which she was told was normal.  She moved to [place] in [place].  She has continued to feel well without shortness of breath or fatigue.  She has had no orthopnea or PND.  She denies peripheral edema.  She had a 2D echocardiogram in January 2021 which showed a mean gradient across aortic valve prosthesis of 41 mmHg with a peak gradient of 64 mmHg.  Left ventricular ejection fraction was 60 to 65% with moderate LVH.  She had a follow-up echocardiogram in January 2022 which showed the mean gradient across the aortic valve to be down slightly at 30.5 mmHg.  Peak gradient was 52.6 mmHg.  Left ventricular ejection fraction remained 60 to 65% with mild LVH and normal LV internal dimensions.  The ascending aorta was measured at 44 mm.  She had a gated cardiac CTA on 04/26/2020 which showed a high attenuation layer extending from the sewing ring to the leaflet base consistent with pannus.  There is no thrombus present.  The opening angle was 56.7 degrees with a normal closing angle of 123 degrees.  The sinuses of Valsalva measured 40 mm with the sinotubular junction of 41 mm and an ascending aorta of 48 mm.  Coronary artery origin was normal with right dominance.  Left ventricular ejection fraction was 72%.  I last saw her on 06/23/2020 and since she had normal left ventricular systolic function and normal internal LV dimensions and was  active and asymptomatic I thought it be best to continue following this.  She had a follow-up echocardiogram on 03/02/2021 showing the mean gradient across the [facility] mechanical aortic valve was 33 mmHg with a dimensionless index of 0.22 which were abnormal but essentially stable.  The ascending aorta was measured at 4.6 cm.  Left ventricular ejection fraction was 60 to 65% with mild LVH and grade 2 diastolic dysfunction with LV internal dimensions that were still within the normal range.  She continues to feel well and remains active without any symptoms at all.  Current Outpatient Medications  Medication Sig Dispense Refill   albuterol (PROVENTIL HFA;VENTOLIN HFA) 108 (90 Base) MCG/ACT inhaler Inhale 1-2 puffs into the lungs every 6 (six) hours as needed for wheezing. 1 Inhaler 0   atorvastatin (LIPITOR) 40 MG tablet TAKE 1 TABLET BY MOUTH EVERY DAY 90 tablet 3   clindamycin (CLEOCIN) 300 MG capsule Take 300 mg by mouth 2 (two) times daily. Prior to dental procedure     JARDIANCE 25 MG TABS tablet TAKE 1 TABLET BY MOUTH EVERY DAY 30 tablet 1   lisinopril (ZESTRIL) 10 MG tablet Take 10 mg by mouth daily.     metoprolol succinate (TOPROL-XL) 25 MG 24 hr tablet Take 1 tablet (25 mg total) by mouth daily. 90 tablet 0   metoprolol tartrate (LOPRESSOR) 50 MG tablet Take 1 tablet 2 hours prior to CT Scan. 1 tablet 0   Semaglutide (OZEMPIC, 0.25 OR 0.5 MG/DOSE,  [place]) Inject 0.5 mg into the skin. Once weekly     warfarin (COUMADIN) 5 MG tablet TAKE AS DIRECTED. 60 DAY SUPPLY 150 tablet 1   No current facility-administered medications for this visit.     Physical Exam: BP (!) 155/67   Pulse 72   Resp 20   Ht '5\' 1"'$  (1.549 m)   Wt 245 lb (111.1 kg)   SpO2 100%   BMI 46.29 kg/m  She looks well. Cardiac exam shows a regular rate and rhythm with crisp mechanical valve click.  There is a 2/6 systolic murmur along the right sternal border.  There is no diastolic murmur Lungs are clear. There is no  peripheral edema.  Diagnostic Tests:    ECHOCARDIOGRAM REPORT         Patient Name:   MAYERLY KAMAN Date of Exam: 03/02/2021  Medical Rec #:  [id number]            Height:       61.0 in  Accession #:    [id number]           Weight:       250.0 lb  Date of Birth:  09/01/1955            BSA:          2.077 m  Patient Age:    21 years             BP:           178/107 mmHg  Patient Gender: F                    HR:           70 bpm.  Exam Location:  [address]   Procedure: 2D Echo, Cardiac Doppler and Color Doppler   Indications:    Z95.2 s/p AVR     History:        Patient has prior history of Echocardiogram examinations,  most                  recent 03/02/2020. Risk Factors:Hypertension, Diabetes and  HLD.                  Aortic Valve: St. Jude valve is present in the aortic  position.                  Procedure Date: 03/05/2014.     Sonographer:    Marygrace Drought RCS  Referring Phys: [place]     1. Left ventricular ejection fraction, by estimation, is 60 to 65%. The  left ventricle has normal function. The left ventricle has no regional  wall motion abnormalities. There is mild left ventricular hypertrophy.  Left ventricular diastolic parameters  are consistent with Grade II diastolic dysfunction (pseudonormalization).   2. Right ventricular systolic function is mildly reduced. The right  ventricular size is mildly enlarged. There is normal pulmonary artery  systolic pressure. The estimated right ventricular systolic pressure is  07.6 mmHg.   3. The mitral valve is degenerative. Trivial mitral valve regurgitation.  No evidence of mitral stenosis. Moderate mitral annular calcification.   4. There is a Probation officer aortic valve. Mean gradient of 33 mmHg  and dimensionless index of 0.22 are abnormal, suggesting significant  obstruction. No significant regurgitation noted.   5. Aortic dilatation noted. There is moderate  dilatation of the ascending  aorta, measuring 46 mm.  6. The inferior vena cava is normal in size with greater than 50%  respiratory variability, suggesting right atrial pressure of 3 mmHg.   7. Consider further evaluation of the mechanical aortic valve by CT or  TEE.   FINDINGS   Left Ventricle: Left ventricular ejection fraction, by estimation, is 60  to 65%. The left ventricle has normal function. The left ventricle has no  regional wall motion abnormalities. The left ventricular internal cavity  size was normal in size. There is   mild left ventricular hypertrophy. Left ventricular diastolic parameters  are consistent with Grade II diastolic dysfunction (pseudonormalization).   Right Ventricle: The right ventricular size is mildly enlarged. No  increase in right ventricular wall thickness. Right ventricular systolic  function is mildly reduced. There is normal pulmonary artery systolic  pressure. The tricuspid regurgitant velocity   is 2.17 m/s, and with an assumed right atrial pressure of 3 mmHg, the  estimated right ventricular systolic pressure is 88.5 mmHg.   Left Atrium: Left atrial size was normal in size.   Right Atrium: Right atrial size was normal in size.   Pericardium: There is no evidence of pericardial effusion.   Mitral Valve: The mitral valve is degenerative in appearance. Moderate  mitral annular calcification. Trivial mitral valve regurgitation. No  evidence of mitral valve stenosis.   Tricuspid Valve: The tricuspid valve is normal in structure. Tricuspid  valve regurgitation is trivial.   Aortic Valve: There is a Probation officer aortic valve. Mean gradient of  33 mmHg and dimensionless index of 0.22 are abnormal, suggesting  significant obstruction. No significant regurgitation noted. The aortic  valve has been repaired/replaced. Aortic  valve regurgitation is not visualized. Aortic valve mean gradient measures  33.0 mmHg. Aortic valve peak gradient  measures 51.6 mmHg. Aortic valve  area, by VTI measures 0.72 cm. There is a St. Jude valve present in the  aortic position. Procedure Date:  03/05/2014.   Pulmonic Valve: The pulmonic valve was normal in structure. Pulmonic valve  regurgitation is not visualized.   Aorta: The aortic root is normal in size and structure and aortic  dilatation noted. There is moderate dilatation of the ascending aorta,  measuring 46 mm.   Venous: The inferior vena cava is normal in size with greater than 50%  respiratory variability, suggesting right atrial pressure of 3 mmHg.   IAS/Shunts: No atrial level shunt detected by color flow Doppler.      LEFT VENTRICLE  PLAX 2D  LVIDd:         4.80 cm   Diastology  LVIDs:         3.40 cm   LV e' medial:    7.83 cm/s  LV PW:         1.20 cm   LV E/e' medial:  13.2  LV IVS:        1.20 cm   LV e' lateral:   8.05 cm/s  LVOT diam:     1.90 cm   LV E/e' lateral: 12.8  LV SV:         65  LV SV Index:   31  LVOT Area:     2.84 cm      RIGHT VENTRICLE  RV Basal diam:  3.90 cm  RV S prime:     10.60 cm/s  TAPSE (M-mode): 1.9 cm  RVSP:           21.8 mmHg   LEFT ATRIUM  Index        RIGHT ATRIUM           Index  LA diam:        3.90 cm 1.88 cm/m   RA Pressure: 3.00 mmHg  LA Vol (A2C):   41.1 ml 19.79 ml/m  RA Area:     12.40 cm  LA Vol (A4C):   29.6 ml 14.25 ml/m  RA Volume:   28.90 ml  13.92 ml/m  LA Biplane Vol: 36.9 ml 17.77 ml/m   AORTIC VALVE  AV Area (Vmax):    0.64 cm  AV Area (Vmean):   0.63 cm  AV Area (VTI):     0.72 cm  AV Vmax:           359.00 cm/s  AV Vmean:          276.000 cm/s  AV VTI:            0.904 m  AV Peak Grad:      51.6 mmHg  AV Mean Grad:      33.0 mmHg  LVOT Vmax:         80.60 cm/s  LVOT Vmean:        61.200 cm/s  LVOT VTI:          0.228 m  LVOT/AV VTI ratio: 0.25     AORTA  Ao Root diam: 3.10 cm  Ao Asc diam:  4.60 cm   MITRAL VALVE                TRICUSPID VALVE  MV Area (PHT):               TR Peak grad:   18.8 mmHg  MV Decel Time:              TR Vmax:        217.00 cm/s  MV E velocity: 103.00 cm/s  Estimated RAP:  3.00 mmHg  MV A velocity: 100.00 cm/s  RVSP:           21.8 mmHg  MV E/A ratio:  1.03                              SHUNTS                              Systemic VTI:  0.23 m                              Systemic Diam: 1.90 cm   Dalton McleanMD  Electronically signed by Franki Monte  Signature Date/Time: 03/02/2021/3:46:18 PM         Final     Narrative & Impression  CLINICAL DATA:  History of severe aortic stenosis. Known thoracic aortic aneurysm.   EXAM: CT ANGIOGRAPHY CHEST WITH CONTRAST   TECHNIQUE: Multidetector CT imaging of the chest was performed using the standard protocol during bolus administration of intravenous contrast. Multiplanar CT image reconstructions and MIPs were obtained to evaluate the vascular anatomy.   RADIATION DOSE REDUCTION: This exam was performed according to the departmental dose-optimization program which includes automated exposure control, adjustment of the mA and/or kV according to patient size and/or use of iterative reconstruction technique.   CONTRAST:  59m ISOVUE-370 IOPAMIDOL (ISOVUE-370) INJECTION 76%   COMPARISON:  CTA heart 04/26/2020   FINDINGS: Cardiovascular:  Patient has a mechanical aortic valve replacement. Again noted are postsurgical changes along the anterior aspect of the ascending thoracic aorta. Ascending thoracic aorta measures up to 4.8 cm and stable. Coronary artery calcifications. No evidence to suggest an aortic dissection. Great vessels are patent. Proximal descending thoracic aorta measures 2.4 cm. Heart size is normal. No significant pericardial effusion. Limited evaluation of the pulmonary arteries due to the phase of contrast.   Mediastinum/Nodes: Mediastinal and hilar calcifications are compatible with old granulomatous disease. No significant lymph node enlargement. No  axillary lymph node enlargement. Thyroid tissue appears to be enlarged with nodules. Difficult to exclude a prominent left thyroid nodule. Esophagus is unremarkable.   Lungs/Pleura: Trachea and mainstem bronchi are patent. 3 mm nodule in the right lower lobe on 7/94 appears to be stable. Stable punctate nodule in the lingula on 7/106. Parenchymal calcifications in left lower lobe on image 90. Again noted is a nodular density along the left major fissure that measures up to 6 x 4 mm and stable. Dependent densities in both lower lobes. No large areas of airspace disease or consolidation. No pleural effusions.   Upper Abdomen: Images of the upper abdomen are unremarkable.   Musculoskeletal: Median sternotomy wires. No acute bone abnormality.   Review of the MIP images confirms the above findings.   IMPRESSION: 1. Stable fusiform aneurysm of the ascending thoracic aorta measuring up to 4.8 cm. Ascending thoracic aortic aneurysm. Recommend semi-annual imaging followup by CTA or MRA. This recommendation follows 2010 ACCF/AHA/AATS/ACR/ASA/SCA/SCAI/SIR/STS/SVM Guidelines for the Diagnosis and Management of Patients With Thoracic Aortic Disease. Circulation. 2010; 121: X450-T888. Aortic aneurysm NOS (ICD10-I71.9) 2. Postoperative changes from aortic valve replacement. 3. Stable small pulmonary nodules. Largest pulmonary nodule has a mean diameter of 5 mm along the left major fissure. 4. Thyroid tissue is enlarged with heterogeneity and small nodules. Difficult exclude a prominent left thyroid nodule and recommend thyroid ultrasound.     Electronically Signed   By: Markus Daft M.D.   On: 08/17/2021 11:37     Impression:  This 66 year old woman has asymptomatic moderate prosthetic aortic valve stenosis which is likely due to a combination of patient prosthesis mismatch related to morbid obesity with a BMI of 46 with a 21 mm mechanical valve as well as some pannus ingrowth below the valve  preventing full opening of the leaflets as measured on previous cardiac CTA.  The gradient across the aortic valve prosthesis was measured at 33 mmHg compared to 31 mmHg 1 year prior.  CTA of the chest shows a stable 4.8 cm fusiform ascending aortic aneurysm that is still well below the surgical threshold.  Given her asymptomatic state, high operative risk with complicated redo surgery and morbid obesity, and stable cardiac size and function I think it be best to continue following this.  She is in agreement with that.  CTA of the chest also showed an enlarged thyroid gland with heterogeneity and small nodules.  Radiology recommended doing a thyroid ultrasound and we will schedule that.  Plan:  She will have a thyroid ultrasound scheduled to evaluate her thyroid nodules and I will call her with results.  She will return to see me in 1 year with a CTA of the chest to follow her ascending aortic aneurysm.  She will continue to follow-up with Dr. Irish Lack concerning her asymptomatic moderate prosthetic aortic valve stenosis.  I spent 20 minutes performing this established patient evaluation and > 50% of this time was spent face to face counseling and  coordinating the care of this patient's aortic aneurysm and moderate prosthetic aortic valve stenosis.    Gaye Pollack, MD [facility] [phone]

## 2021-08-19 ENCOUNTER — Ambulatory Visit
Admission: RE | Admit: 2021-08-19 | Discharge: 2021-08-19 | Disposition: A | Discharge status: Home / Self Care | Payer: Medicare HMO | Source: Ambulatory Visit | Attending: Internal Medicine | Admitting: Internal Medicine

## 2021-08-19 DIAGNOSIS — Z1231 Encounter for screening mammogram for malignant neoplasm of breast: Secondary | ICD-10-CM

## 2021-08-24 ENCOUNTER — Ambulatory Visit
Admission: RE | Admit: 2021-08-24 | Discharge: 2021-08-24 | Disposition: A | Discharge status: Home / Self Care | Payer: Medicare HMO | Source: Ambulatory Visit | Attending: Surgery | Admitting: Surgery

## 2021-08-24 ENCOUNTER — Ambulatory Visit (INDEPENDENT_AMBULATORY_CARE_PROVIDER_SITE_OTHER): Payer: Medicare HMO | Admitting: *Deleted

## 2021-08-24 DIAGNOSIS — E041 Nontoxic single thyroid nodule: Secondary | ICD-10-CM

## 2021-08-24 DIAGNOSIS — E042 Nontoxic multinodular goiter: Secondary | ICD-10-CM | POA: Diagnosis not present

## 2021-08-24 DIAGNOSIS — Z7901 Long term (current) use of anticoagulants: Secondary | ICD-10-CM

## 2021-08-24 DIAGNOSIS — Z952 Presence of prosthetic heart valve: Secondary | ICD-10-CM

## 2021-08-24 LAB — POCT INR: INR: 3.4 — AB (ref 2.0–3.0)

## 2021-09-07 ENCOUNTER — Ambulatory Visit (INDEPENDENT_AMBULATORY_CARE_PROVIDER_SITE_OTHER): Payer: Medicare HMO | Admitting: *Deleted

## 2021-09-07 DIAGNOSIS — Z952 Presence of prosthetic heart valve: Secondary | ICD-10-CM

## 2021-09-07 DIAGNOSIS — Z7901 Long term (current) use of anticoagulants: Secondary | ICD-10-CM

## 2021-09-07 LAB — POCT INR: INR: 4.3 — AB (ref 2.0–3.0)

## 2021-09-07 NOTE — Patient Instructions (Signed)
Description   Called and spoke with pt & instructed pt to HOLD today's dose and then continue taking 1.5 tablets daily except for 2 tablets on Sundays. Recheck INR in 1 week (self tester-2 weeks). [facility] [phone].

## 2021-09-14 ENCOUNTER — Ambulatory Visit (INDEPENDENT_AMBULATORY_CARE_PROVIDER_SITE_OTHER): Payer: Medicare HMO

## 2021-09-14 DIAGNOSIS — Z952 Presence of prosthetic heart valve: Secondary | ICD-10-CM

## 2021-09-14 DIAGNOSIS — Z7901 Long term (current) use of anticoagulants: Secondary | ICD-10-CM

## 2021-09-14 LAB — POCT INR: INR: 4 — AB (ref 2.0–3.0)

## 2021-09-14 NOTE — Patient Instructions (Signed)
Description   Called and spoke with pt & instructed pt to only take 1 tablet today and then START taking 1.5 tablets daily. Recheck INR in 1 week (self tester-2 weeks). [facility] [phone].

## 2021-09-21 ENCOUNTER — Ambulatory Visit (INDEPENDENT_AMBULATORY_CARE_PROVIDER_SITE_OTHER): Payer: Medicare HMO

## 2021-09-21 DIAGNOSIS — Z952 Presence of prosthetic heart valve: Secondary | ICD-10-CM

## 2021-09-21 DIAGNOSIS — Z7901 Long term (current) use of anticoagulants: Secondary | ICD-10-CM | POA: Diagnosis not present

## 2021-09-21 LAB — POCT INR: INR: 3.5 — AB (ref 2.0–3.0)

## 2021-09-21 NOTE — Patient Instructions (Signed)
Description   Called and spoke with pt. Instructed to continue taking 1.5 tablets daily.  Recheck INR in 2 weeks. [facility] [phone].

## 2021-10-05 ENCOUNTER — Ambulatory Visit (INDEPENDENT_AMBULATORY_CARE_PROVIDER_SITE_OTHER): Payer: Medicare HMO | Admitting: *Deleted

## 2021-10-05 DIAGNOSIS — Z952 Presence of prosthetic heart valve: Secondary | ICD-10-CM

## 2021-10-05 DIAGNOSIS — Z7901 Long term (current) use of anticoagulants: Secondary | ICD-10-CM

## 2021-10-05 LAB — POCT INR: INR: 3.2 — AB (ref 2.0–3.0)

## 2021-10-05 NOTE — Patient Instructions (Signed)
Description   Called and spoke with pt and instructed to continue taking 1.5 tablets of warfarin daily.  Recheck INR in 2 weeks. [facility] [phone] or [phone].

## 2021-10-19 ENCOUNTER — Ambulatory Visit (INDEPENDENT_AMBULATORY_CARE_PROVIDER_SITE_OTHER): Payer: Medicare HMO

## 2021-10-19 DIAGNOSIS — Z7901 Long term (current) use of anticoagulants: Secondary | ICD-10-CM | POA: Diagnosis not present

## 2021-10-19 DIAGNOSIS — Z952 Presence of prosthetic heart valve: Secondary | ICD-10-CM

## 2021-10-19 LAB — POCT INR: INR: 4.5 — AB (ref 2.0–3.0)

## 2021-10-19 NOTE — Patient Instructions (Signed)
Description   Called and spoke with pt and instructed to hold today's dose and eat greens and then continue taking 1.5 tablets of warfarin daily.  Recheck INR in 1 week. [facility] [phone].

## 2021-10-26 ENCOUNTER — Ambulatory Visit (INDEPENDENT_AMBULATORY_CARE_PROVIDER_SITE_OTHER): Payer: Medicare HMO | Admitting: *Deleted

## 2021-10-26 DIAGNOSIS — Z7901 Long term (current) use of anticoagulants: Secondary | ICD-10-CM

## 2021-10-26 DIAGNOSIS — Z952 Presence of prosthetic heart valve: Secondary | ICD-10-CM

## 2021-10-26 LAB — POCT INR: INR: 3.1 — AB (ref 2.0–3.0)

## 2021-10-26 NOTE — Patient Instructions (Signed)
Description   Called and spoke with pt and instructed to continue taking 1.5 tablets of warfarin daily.  Recheck INR in 2 weeks. [facility] [phone].

## 2021-11-09 ENCOUNTER — Ambulatory Visit (INDEPENDENT_AMBULATORY_CARE_PROVIDER_SITE_OTHER): Payer: Medicare HMO | Admitting: *Deleted

## 2021-11-09 DIAGNOSIS — Z7901 Long term (current) use of anticoagulants: Secondary | ICD-10-CM

## 2021-11-09 DIAGNOSIS — Z952 Presence of prosthetic heart valve: Secondary | ICD-10-CM | POA: Diagnosis not present

## 2021-11-09 LAB — POCT INR: INR: 4.1 — AB (ref 2.0–3.0)

## 2021-11-09 NOTE — Patient Instructions (Signed)
Description   Called and spoke with pt and instructed to HOLD today's dose and then continue taking 1.5 tablets of warfarin daily.  Recheck INR in 1 week. [facility] [phone].  Stay consistent with greens (daily)

## 2021-11-16 ENCOUNTER — Ambulatory Visit (INDEPENDENT_AMBULATORY_CARE_PROVIDER_SITE_OTHER): Payer: Medicare HMO

## 2021-11-16 DIAGNOSIS — Z7901 Long term (current) use of anticoagulants: Secondary | ICD-10-CM | POA: Diagnosis not present

## 2021-11-16 DIAGNOSIS — Z952 Presence of prosthetic heart valve: Secondary | ICD-10-CM

## 2021-11-16 LAB — POCT INR: INR: 2.9 (ref 2.0–3.0)

## 2021-11-16 NOTE — Patient Instructions (Signed)
Description   Called and spoke with pt. Instructed to continue taking 1.5 tablets of warfarin daily.  Stay consistent with greens (daily)  Recheck INR in 2 weeks.  [facility] [phone].

## 2021-11-30 ENCOUNTER — Ambulatory Visit (INDEPENDENT_AMBULATORY_CARE_PROVIDER_SITE_OTHER): Payer: Medicare HMO | Admitting: *Deleted

## 2021-11-30 DIAGNOSIS — Z7901 Long term (current) use of anticoagulants: Secondary | ICD-10-CM | POA: Diagnosis not present

## 2021-11-30 DIAGNOSIS — Z952 Presence of prosthetic heart valve: Secondary | ICD-10-CM

## 2021-11-30 LAB — POCT INR: INR: 3.2 — AB (ref 2.0–3.0)

## 2021-11-30 NOTE — Patient Instructions (Signed)
Description   Called and spoke with pt and instructed to continue taking 1.5 tablets of warfarin daily.  Stay consistent with greens (daily). Recheck INR in 2 weeks-self tester.  [facility] [phone].       

## 2021-12-14 ENCOUNTER — Ambulatory Visit (INDEPENDENT_AMBULATORY_CARE_PROVIDER_SITE_OTHER): Payer: Medicare HMO

## 2021-12-14 DIAGNOSIS — Z7901 Long term (current) use of anticoagulants: Secondary | ICD-10-CM | POA: Diagnosis not present

## 2021-12-14 DIAGNOSIS — Z952 Presence of prosthetic heart valve: Secondary | ICD-10-CM | POA: Diagnosis not present

## 2021-12-14 LAB — POCT INR: INR: 3 (ref 2.0–3.0)

## 2021-12-14 NOTE — Patient Instructions (Signed)
Description   Called and spoke with pt and instructed to continue taking 1.5 tablets of warfarin daily.  Stay consistent with greens (daily). Recheck INR in 2 weeks-self tester.  [facility] [phone].       

## 2021-12-15 DIAGNOSIS — L578 Other skin changes due to chronic exposure to nonionizing radiation: Secondary | ICD-10-CM | POA: Diagnosis not present

## 2021-12-15 DIAGNOSIS — D225 Melanocytic nevi of trunk: Secondary | ICD-10-CM | POA: Diagnosis not present

## 2021-12-15 DIAGNOSIS — L814 Other melanin hyperpigmentation: Secondary | ICD-10-CM | POA: Diagnosis not present

## 2021-12-15 DIAGNOSIS — L821 Other seborrheic keratosis: Secondary | ICD-10-CM | POA: Diagnosis not present

## 2021-12-15 DIAGNOSIS — L570 Actinic keratosis: Secondary | ICD-10-CM | POA: Diagnosis not present

## 2021-12-28 ENCOUNTER — Ambulatory Visit (INDEPENDENT_AMBULATORY_CARE_PROVIDER_SITE_OTHER): Payer: Medicare HMO

## 2021-12-28 DIAGNOSIS — Z952 Presence of prosthetic heart valve: Secondary | ICD-10-CM

## 2021-12-28 DIAGNOSIS — Z7901 Long term (current) use of anticoagulants: Secondary | ICD-10-CM

## 2021-12-28 LAB — POCT INR: INR: 3.1 — AB (ref 2.0–3.0)

## 2021-12-28 NOTE — Patient Instructions (Signed)
Description   Called and spoke with pt and instructed to continue taking 1.5 tablets of warfarin daily.  Stay consistent with greens (daily). Recheck INR in 2 weeks-self tester.  [facility] [phone].

## 2021-12-30 DIAGNOSIS — E1159 Type 2 diabetes mellitus with other circulatory complications: Secondary | ICD-10-CM | POA: Diagnosis not present

## 2021-12-30 DIAGNOSIS — Z952 Presence of prosthetic heart valve: Secondary | ICD-10-CM | POA: Diagnosis not present

## 2021-12-30 DIAGNOSIS — I10 Essential (primary) hypertension: Secondary | ICD-10-CM | POA: Diagnosis not present

## 2021-12-30 DIAGNOSIS — E785 Hyperlipidemia, unspecified: Secondary | ICD-10-CM | POA: Diagnosis not present

## 2021-12-30 DIAGNOSIS — R82998 Other abnormal findings in urine: Secondary | ICD-10-CM | POA: Diagnosis not present

## 2021-12-30 DIAGNOSIS — Z23 Encounter for immunization: Secondary | ICD-10-CM | POA: Diagnosis not present

## 2022-01-11 ENCOUNTER — Ambulatory Visit (INDEPENDENT_AMBULATORY_CARE_PROVIDER_SITE_OTHER): Payer: Medicare HMO | Admitting: *Deleted

## 2022-01-11 DIAGNOSIS — Z7901 Long term (current) use of anticoagulants: Secondary | ICD-10-CM | POA: Diagnosis not present

## 2022-01-11 DIAGNOSIS — Z5181 Encounter for therapeutic drug level monitoring: Secondary | ICD-10-CM | POA: Diagnosis not present

## 2022-01-11 DIAGNOSIS — Z952 Presence of prosthetic heart valve: Secondary | ICD-10-CM

## 2022-01-11 LAB — POCT INR: INR: 3.8 — AB (ref 2.0–3.0)

## 2022-01-11 NOTE — Patient Instructions (Signed)
Description   Called and spoke with pt and instructed to HOLD today's dose and then continue taking 1.5 tablets of warfarin daily.  Stay consistent with greens (daily). Recheck INR in 2 weeks-self tester.  [facility] [phone].

## 2022-01-19 DIAGNOSIS — Z6841 Body Mass Index (BMI) 40.0 and over, adult: Secondary | ICD-10-CM | POA: Diagnosis not present

## 2022-01-19 DIAGNOSIS — Z01411 Encounter for gynecological examination (general) (routine) with abnormal findings: Secondary | ICD-10-CM | POA: Diagnosis not present

## 2022-01-19 DIAGNOSIS — Z124 Encounter for screening for malignant neoplasm of cervix: Secondary | ICD-10-CM | POA: Diagnosis not present

## 2022-01-19 DIAGNOSIS — Z01419 Encounter for gynecological examination (general) (routine) without abnormal findings: Secondary | ICD-10-CM | POA: Diagnosis not present

## 2022-01-19 DIAGNOSIS — Z0000 Encounter for general adult medical examination without abnormal findings: Secondary | ICD-10-CM | POA: Diagnosis not present

## 2022-01-25 ENCOUNTER — Ambulatory Visit (INDEPENDENT_AMBULATORY_CARE_PROVIDER_SITE_OTHER): Payer: Medicare HMO

## 2022-01-25 DIAGNOSIS — Z952 Presence of prosthetic heart valve: Secondary | ICD-10-CM

## 2022-01-25 DIAGNOSIS — Z7901 Long term (current) use of anticoagulants: Secondary | ICD-10-CM

## 2022-01-25 LAB — POCT INR: INR: 4.3 — AB (ref 2.0–3.0)

## 2022-01-25 NOTE — Patient Instructions (Signed)
Description   Called and spoke with pt and instructed to HOLD today's dose and only take 1 tablet tomorrow and then continue taking 1.5 tablets of warfarin daily.  Stay consistent with greens (daily).  Recheck INR in 2 weeks-self tester.  [facility] [phone].

## 2022-02-08 ENCOUNTER — Telehealth: Payer: Self-pay | Admitting: Interventional Cardiology

## 2022-02-08 ENCOUNTER — Ambulatory Visit (INDEPENDENT_AMBULATORY_CARE_PROVIDER_SITE_OTHER): Payer: Medicare HMO

## 2022-02-08 DIAGNOSIS — Z5181 Encounter for therapeutic drug level monitoring: Secondary | ICD-10-CM | POA: Diagnosis not present

## 2022-02-08 DIAGNOSIS — Z952 Presence of prosthetic heart valve: Secondary | ICD-10-CM | POA: Diagnosis not present

## 2022-02-08 LAB — POCT INR: INR: 3.7 — AB (ref 2.0–3.0)

## 2022-02-08 NOTE — Patient Instructions (Signed)
Description   Called and spoke with pt and instructed to only take 1/2 tablet today and then START taking 1.5 tablets of warfarin daily EXCEPT 1 tablet on Sundays.  Stay consistent with greens (daily).  Recheck INR in 2 weeks-self tester.  [facility] [phone].

## 2022-02-08 NOTE — Telephone Encounter (Signed)
Pt is requesting call in regards to INR readings that she had sent in. Requesting call back.

## 2022-02-08 NOTE — Telephone Encounter (Signed)
Returned pt's call. Please refer to anticoagulation encounter .

## 2022-02-22 ENCOUNTER — Ambulatory Visit (INDEPENDENT_AMBULATORY_CARE_PROVIDER_SITE_OTHER): Payer: Medicare HMO

## 2022-02-22 DIAGNOSIS — Z7901 Long term (current) use of anticoagulants: Secondary | ICD-10-CM | POA: Diagnosis not present

## 2022-02-22 DIAGNOSIS — Z952 Presence of prosthetic heart valve: Secondary | ICD-10-CM

## 2022-02-22 LAB — POCT INR: INR: 2 (ref 2.0–3.0)

## 2022-02-22 NOTE — Patient Instructions (Signed)
Description   Called and spoke with pt and instructed to take 2 tablets today and then continue taking 1.5 tablets of warfarin daily EXCEPT 1 tablet on Sundays.  Stay consistent with greens (daily).  Recheck INR in 2 weeks-self tester.  [facility] [phone].

## 2022-03-08 ENCOUNTER — Ambulatory Visit (HOSPITAL_COMMUNITY): Payer: Medicare HMO | Attending: Cardiology

## 2022-03-08 ENCOUNTER — Ambulatory Visit (INDEPENDENT_AMBULATORY_CARE_PROVIDER_SITE_OTHER): Payer: Medicare HMO | Admitting: *Deleted

## 2022-03-08 DIAGNOSIS — Z952 Presence of prosthetic heart valve: Secondary | ICD-10-CM

## 2022-03-08 DIAGNOSIS — Z7901 Long term (current) use of anticoagulants: Secondary | ICD-10-CM

## 2022-03-08 DIAGNOSIS — I77810 Thoracic aortic ectasia: Secondary | ICD-10-CM | POA: Diagnosis not present

## 2022-03-08 LAB — ECHOCARDIOGRAM COMPLETE
AR max vel: 0.75 cm2
AV Area VTI: 0.79 cm2
AV Area mean vel: 0.74 cm2
AV Mean grad: 23.5 mmHg
AV Peak grad: 42.3 mmHg
Ao pk vel: 3.25 m/s
Area-P 1/2: 3.66 cm2
S' Lateral: 1.8 cm

## 2022-03-08 LAB — POCT INR: INR: 2.4 (ref 2.0–3.0)

## 2022-03-22 ENCOUNTER — Ambulatory Visit (INDEPENDENT_AMBULATORY_CARE_PROVIDER_SITE_OTHER): Payer: Medicare HMO

## 2022-03-22 DIAGNOSIS — Z5181 Encounter for therapeutic drug level monitoring: Secondary | ICD-10-CM | POA: Diagnosis not present

## 2022-03-22 LAB — POCT INR: INR: 2.9 (ref 2.0–3.0)

## 2022-03-22 NOTE — Patient Instructions (Signed)
Description   Called and spoke with pt and instructed to take continue taking 1.5 tablets of warfarin daily. Stay consistent with greens (daily).  Recheck INR in 2 weeks-self tester.  [facility] [phone].       

## 2022-04-05 ENCOUNTER — Ambulatory Visit (INDEPENDENT_AMBULATORY_CARE_PROVIDER_SITE_OTHER): Payer: Medicare HMO

## 2022-04-05 DIAGNOSIS — Z5181 Encounter for therapeutic drug level monitoring: Secondary | ICD-10-CM | POA: Diagnosis not present

## 2022-04-05 LAB — POCT INR: INR: 3.5 — AB (ref 2.0–3.0)

## 2022-04-05 NOTE — Patient Instructions (Signed)
Description   Called and spoke with pt and instructed to take continue taking 1.5 tablets of warfarin daily. Stay consistent with greens (daily).  Recheck INR in 2 weeks-self tester.  [facility] [phone].

## 2022-04-19 ENCOUNTER — Ambulatory Visit (INDEPENDENT_AMBULATORY_CARE_PROVIDER_SITE_OTHER): Payer: Medicare HMO

## 2022-04-19 DIAGNOSIS — Z5181 Encounter for therapeutic drug level monitoring: Secondary | ICD-10-CM

## 2022-04-19 LAB — POCT INR: INR: 2.9 (ref 2.0–3.0)

## 2022-04-19 NOTE — Patient Instructions (Signed)
Description   Called and spoke with pt and instructed to take continue taking 1.5 tablets of warfarin daily. Stay consistent with greens (daily).  Recheck INR in 2 weeks-self tester.  [facility] [phone].

## 2022-05-03 ENCOUNTER — Ambulatory Visit (INDEPENDENT_AMBULATORY_CARE_PROVIDER_SITE_OTHER): Payer: Medicare HMO | Admitting: *Deleted

## 2022-05-03 DIAGNOSIS — Z7901 Long term (current) use of anticoagulants: Secondary | ICD-10-CM | POA: Diagnosis not present

## 2022-05-03 DIAGNOSIS — Z952 Presence of prosthetic heart valve: Secondary | ICD-10-CM | POA: Diagnosis not present

## 2022-05-03 LAB — POCT INR: INR: 2.8 (ref 2.0–3.0)

## 2022-05-03 NOTE — Patient Instructions (Signed)
Description   Called and spoke with pt and instructed to take continue taking 1.5 tablets of warfarin daily. Stay consistent with greens (daily).  Recheck INR in 2 weeks-self tester.  [facility] [phone].       

## 2022-05-17 ENCOUNTER — Ambulatory Visit (INDEPENDENT_AMBULATORY_CARE_PROVIDER_SITE_OTHER): Payer: Medicare HMO

## 2022-05-17 DIAGNOSIS — Z7901 Long term (current) use of anticoagulants: Secondary | ICD-10-CM | POA: Diagnosis not present

## 2022-05-17 DIAGNOSIS — Z952 Presence of prosthetic heart valve: Secondary | ICD-10-CM | POA: Diagnosis not present

## 2022-05-17 LAB — POCT INR: INR: 2.5 (ref 2.0–3.0)

## 2022-05-17 NOTE — Patient Instructions (Signed)
Description   Spoke with pt and instructed to take continue taking 1.5 tablets of warfarin daily. Stay consistent with greens (daily).  Recheck INR in 2 weeks-self tester.  [facility] [phone].

## 2022-05-31 ENCOUNTER — Ambulatory Visit (INDEPENDENT_AMBULATORY_CARE_PROVIDER_SITE_OTHER): Payer: Medicare HMO

## 2022-05-31 DIAGNOSIS — Z5181 Encounter for therapeutic drug level monitoring: Secondary | ICD-10-CM

## 2022-05-31 LAB — POCT INR: INR: 3 (ref 2.0–3.0)

## 2022-05-31 NOTE — Patient Instructions (Signed)
Description   Spoke with pt and instructed to take continue taking 1.5 tablets of warfarin daily. Stay consistent with greens (daily).  Recheck INR in 2 weeks-self tester.  [facility] [phone].      

## 2022-06-05 ENCOUNTER — Encounter: Payer: Self-pay | Admitting: *Deleted

## 2022-06-14 ENCOUNTER — Ambulatory Visit (INDEPENDENT_AMBULATORY_CARE_PROVIDER_SITE_OTHER): Payer: Medicare HMO | Admitting: *Deleted

## 2022-06-14 DIAGNOSIS — Z7901 Long term (current) use of anticoagulants: Secondary | ICD-10-CM

## 2022-06-14 DIAGNOSIS — Z952 Presence of prosthetic heart valve: Secondary | ICD-10-CM

## 2022-06-14 LAB — POCT INR: INR: 4.1 — AB (ref 2.0–3.0)

## 2022-06-14 NOTE — Patient Instructions (Addendum)
Description   Spoke with pt and instructed not take today's dose then continue taking 1.5 tablets of warfarin daily. Stay consistent with greens (daily).  Recheck INR in 1 week (normally 2 weeks-self tester). [facility] [phone].

## 2022-06-21 ENCOUNTER — Ambulatory Visit (INDEPENDENT_AMBULATORY_CARE_PROVIDER_SITE_OTHER): Payer: Medicare HMO | Admitting: *Deleted

## 2022-06-21 DIAGNOSIS — Z952 Presence of prosthetic heart valve: Secondary | ICD-10-CM | POA: Diagnosis not present

## 2022-06-21 DIAGNOSIS — Z7901 Long term (current) use of anticoagulants: Secondary | ICD-10-CM | POA: Diagnosis not present

## 2022-06-21 LAB — POCT INR: INR: 3 (ref 2.0–3.0)

## 2022-06-21 NOTE — Patient Instructions (Addendum)
Description   Spoke with pt and instructed pt to continue taking 1.5 tablets of warfarin daily. Stay consistent with greens (daily).  Recheck INR in 2 weeks (normally 2 weeks-self tester). [facility] [phone] or [phone].

## 2022-06-29 DIAGNOSIS — R7989 Other specified abnormal findings of blood chemistry: Secondary | ICD-10-CM | POA: Diagnosis not present

## 2022-06-29 DIAGNOSIS — E785 Hyperlipidemia, unspecified: Secondary | ICD-10-CM | POA: Diagnosis not present

## 2022-06-29 DIAGNOSIS — I10 Essential (primary) hypertension: Secondary | ICD-10-CM | POA: Diagnosis not present

## 2022-06-29 DIAGNOSIS — E1159 Type 2 diabetes mellitus with other circulatory complications: Secondary | ICD-10-CM | POA: Diagnosis not present

## 2022-07-02 NOTE — Progress Notes (Unsigned)
Cardiology Office Note   Date:  07/04/2022   ID:  Denise Curtis, DOB [birth year], MRN [id number]  PCP:  Melida Quitter, MD    No chief complaint on file.  S/p AVR  Wt Readings from Last 3 Encounters:  07/04/22 250 lb (113.4 kg)  08/17/21 245 lb (111.1 kg)  06/22/21 252 lb 3.2 oz (114.4 kg)       History of Present Illness: Denise Curtis is a 67 y.o. female  w/ h/o AVR.  She saw me to establish with a cardiologist after valve replacement surgery.  She moved to [place] from [place] in 9/19.  Her husband's job brought her here.   It was a St. Jude's aortic valve: Serial number: [id number]; Model # [id number]; placed in Mar 05 2014; Dr. Nicholas Lose at [facility]    Done for bicuspid aortic valve.  Per her report, cath was clean prior to surgery.     She has been following a range of 2.5 - 3.5.   She has a home machine.  echo showed increased aortic vlave velocity.  CTA was done showing: "There is a high attenuation layer (HU >145) extending from the sewing ring to the leaflet base consistent with pannus. There is no thrombus present."     Husband had severe COVID in 12/21 along with influenza.   Prior visit with Dr. Lavinia Sharps: "She did see Dr. Laneta Simmers: "  I think if she requires redo aortic valve replacement she would likely require aortic root replacement with a porcine root because I doubt a 21 mm valve could be reinserted.  Then the remainder of her aorta up to the innominate artery would need to be replaced.  This would be a major high risk surgery for this patient but I suspect it will need to be done at some point.  I think as long she has normal left ventricular function without worsening LVH and is asymptomatic it is reasonable to continue following this closely for a while. "  Has a new grandson in [place].  Since 2023, she has done well.  SHe will see Dr. Laneta Simmers in July 2024.  She will be due for a CT scan as well.   Plans to start water aerobics at the [facility] in  the near future.  Overall, she feels very well.  Denies : Chest pain. Dizziness. Leg edema. Nitroglycerin use. Orthopnea. Palpitations. Paroxysmal nocturnal dyspnea. Shortness of breath. Syncope.    Past Medical History:  Diagnosis Date   Arthritis    knee   Diabetes mellitus type II, non insulin dependent (HCC)    Heart murmur    Hyperlipidemia    Hypertension    Migraines     Past Surgical History:  Procedure Laterality Date   APPENDECTOMY  2005   COLONOSCOPY     MECHANICAL AORTIC VALVE REPLACEMENT  2016     Current Outpatient Medications  Medication Sig Dispense Refill   albuterol (PROVENTIL HFA;VENTOLIN HFA) 108 (90 Base) MCG/ACT inhaler Inhale 1-2 puffs into the lungs every 6 (six) hours as needed for wheezing. 1 Inhaler 0   atorvastatin (LIPITOR) 40 MG tablet TAKE 1 TABLET BY MOUTH EVERY DAY 90 tablet 3   clindamycin (CLEOCIN) 300 MG capsule Take 300 mg by mouth 2 (two) times daily. Prior to dental procedure     JARDIANCE 25 MG TABS tablet TAKE 1 TABLET BY MOUTH EVERY DAY 30 tablet 1   lisinopril (ZESTRIL) 10 MG tablet Take 10 mg  by mouth daily.     metoprolol succinate (TOPROL-XL) 25 MG 24 hr tablet Take 1 tablet (25 mg total) by mouth daily. 90 tablet 0   metoprolol tartrate (LOPRESSOR) 50 MG tablet Take 1 tablet 2 hours prior to CT Scan. 1 tablet 0   OZEMPIC, 1 MG/DOSE, 4 MG/3ML SOPN Inject 1 mg into the skin once a week.     warfarin (COUMADIN) 5 MG tablet TAKE AS DIRECTED. 60 DAY SUPPLY 150 tablet 1   Semaglutide (OZEMPIC, 0.25 OR 0.5 MG/DOSE, ) Inject 0.5 mg into the skin. Once weekly (Patient not taking: Reported on 07/04/2022)     No current facility-administered medications for this visit.    Allergies:   Metformin and related, Hydrochlorothiazide, and Penicillins    Social History:  The patient  reports that she has never smoked. She has never used smokeless tobacco. She reports current alcohol use. She reports that she does not use drugs.   Family  History:  The patient's family history includes COPD in her father and mother; Heart disease in her mother; Squamous cell carcinoma (age of onset: 57) in her mother; Stroke in her paternal grandfather.    ROS:  Please see the history of present illness.   Otherwise, review of systems are positive for difficulty losing weight.   All other systems are reviewed and negative.    PHYSICAL EXAM: VS:  BP (!) 152/90   Pulse 81   Ht 5\' 3"  (1.6 m)   Wt 250 lb (113.4 kg)   SpO2 94%   BMI 44.29 kg/m  , BMI Body mass index is 44.29 kg/m. GEN: Well nourished, well developed, in no acute distress HEENT: normal Neck: no JVD, carotid bruits, or masses Cardiac: Crisp S2 click, RRR; 2/6 systolic murmurs, rubs, or gallops,no edema  Respiratory:  clear to auscultation bilaterally, normal work of breathing GI: soft, nontender, nondistended, + BS, obese MS: no deformity or atrophy Skin: warm and dry, no rash Neuro:  Strength and sensation are intact Psych: euthymic mood, full affect   EKG:   The ekg ordered today demonstrates NSR, no ST changes    Recent Labs: No results found for requested labs within last 365 days.   Lipid Panel    Component Value Date/Time   CHOL 156 03/04/2018 0956   TRIG 177.0 (H) 03/04/2018 0956   HDL 39.00 (L) 03/04/2018 0956   CHOLHDL 4 03/04/2018 0956   VLDL 35.4 03/04/2018 0956   LDLCALC 81 03/04/2018 0956   LDLDIRECT 95.0 03/04/2018 0956     Other studies Reviewed: Additional studies/ records that were reviewed today with results demonstrating: LDL 74 in 2023.   ASSESSMENT AND PLAN:  Severe prosthetic valve aortic stenosis: Followed by Dr. Laneta Simmers as well.  No CHF sx. get the annual CT scan and make plans based on that result. Diabetes: Whole food, plant-based diet.  High-fiber diet.  Avoid processed foods. HTN: Continue to monitor blood pressure at home.  If readings are consistently over 140/90,Let us know.  Home readings are typically 120/80 range.   Continue current medications. Hyperlipidemia:  Needs repeat lipids. DOne by PMD. COntinue atorvastatin. Morbid obesity: Healthy diet discussed.  Exercise target below discussed as well.  Water aerobics at the [facility] will be helpful.   Anticoagulated: Checks Coumadin at home. Typically well controlled. Dose has been stable.    Current medicines are reviewed at length with the patient today.  The patient concerns regarding her medicines were addressed.  The following changes have been  made:  No change  Labs/ tests ordered today include:  No orders of the defined types were placed in this encounter.   Recommend 150 minutes/week of aerobic exercise Low fat, low carb, high fiber diet recommended  Disposition:   FU in 1 year   Signed, Lance Muss, MD  07/04/2022 9:39 AM    [facility] [address] [place], [place], [place]  [address] Phone: [phone]; Fax: [phone]

## 2022-07-04 ENCOUNTER — Ambulatory Visit: Payer: Medicare HMO | Attending: Interventional Cardiology | Admitting: Interventional Cardiology

## 2022-07-04 ENCOUNTER — Encounter: Payer: Self-pay | Admitting: Interventional Cardiology

## 2022-07-04 ENCOUNTER — Other Ambulatory Visit: Payer: Self-pay | Admitting: Surgery

## 2022-07-04 VITALS — BP 152/90 | HR 81 | Ht 63.0 in | Wt 250.0 lb

## 2022-07-04 DIAGNOSIS — Z7901 Long term (current) use of anticoagulants: Secondary | ICD-10-CM | POA: Diagnosis not present

## 2022-07-04 DIAGNOSIS — E782 Mixed hyperlipidemia: Secondary | ICD-10-CM

## 2022-07-04 DIAGNOSIS — I77810 Thoracic aortic ectasia: Secondary | ICD-10-CM | POA: Diagnosis not present

## 2022-07-04 DIAGNOSIS — Z7984 Long term (current) use of oral hypoglycemic drugs: Secondary | ICD-10-CM

## 2022-07-04 DIAGNOSIS — E119 Type 2 diabetes mellitus without complications: Secondary | ICD-10-CM | POA: Diagnosis not present

## 2022-07-04 DIAGNOSIS — Z952 Presence of prosthetic heart valve: Secondary | ICD-10-CM

## 2022-07-04 DIAGNOSIS — I7121 Aneurysm of the ascending aorta, without rupture: Secondary | ICD-10-CM

## 2022-07-04 NOTE — Patient Instructions (Signed)
Medication Instructions:  Your physician recommends that you continue on your current medications as directed. Please refer to the Current Medication list given to you today.  *If you need a refill on your cardiac medications before your next appointment, please call your pharmacy*   Lab Work: none If you have labs (blood work) drawn today and your tests are completely normal, you will receive your results only by: MyChart Message (if you have MyChart) OR A paper copy in the mail If you have any lab test that is abnormal or we need to change your treatment, we will call you to review the results.   Testing/Procedures: Your physician has requested that you have an echocardiogram. Echocardiography is a painless test that uses sound waves to create images of your heart. It provides your doctor with information about the size and shape of your heart and how well your heart's chambers and valves are working. This procedure takes approximately one hour. There are no restrictions for this procedure. Please do NOT wear cologne, perfume, aftershave, or lotions (deodorant is allowed). Please arrive 15 minutes prior to your appointment time. To be done around March 12, 2023   Follow-Up: At [facility], you and your health needs are our priority.  As part of our continuing mission to provide you with exceptional heart care, we have created designated Provider Care Teams.  These Care Teams include your primary Cardiologist (physician) and Advanced Practice Providers (APPs -  Physician Assistants and Nurse Practitioners) who all work together to provide you with the care you need, when you need it.  We recommend signing up for the patient portal called "MyChart".  Sign up information is provided on this After Visit Summary.  MyChart is used to connect with patients for Virtual Visits (Telemedicine).  Patients are able to view lab/test results, encounter notes, upcoming appointments, etc.   Non-urgent messages can be sent to your provider as well.   To learn more about what you can do with MyChart, go to [email or web address].    Your next appointment:   12 month(s)  Provider:   Lance Muss, MD     Other Instructions

## 2022-07-05 ENCOUNTER — Ambulatory Visit (INDEPENDENT_AMBULATORY_CARE_PROVIDER_SITE_OTHER): Payer: Medicare HMO | Admitting: *Deleted

## 2022-07-05 DIAGNOSIS — Z952 Presence of prosthetic heart valve: Secondary | ICD-10-CM

## 2022-07-05 DIAGNOSIS — Z7901 Long term (current) use of anticoagulants: Secondary | ICD-10-CM | POA: Diagnosis not present

## 2022-07-05 LAB — POCT INR: INR: 3.5 — AB (ref 2.0–3.0)

## 2022-07-05 NOTE — Patient Instructions (Signed)
Description   Spoke with pt and instructed pt to continue taking 1.5 tablets of warfarin daily. Stay consistent with greens (daily).  Recheck INR in 2 weeks (normally 2 weeks-self tester). Cou[facility]in Clinic [phone] or [phone].       

## 2022-07-06 DIAGNOSIS — Z7901 Long term (current) use of anticoagulants: Secondary | ICD-10-CM | POA: Diagnosis not present

## 2022-07-06 DIAGNOSIS — M8589 Other specified disorders of bone density and structure, multiple sites: Secondary | ICD-10-CM | POA: Diagnosis not present

## 2022-07-06 DIAGNOSIS — Z1339 Encounter for screening examination for other mental health and behavioral disorders: Secondary | ICD-10-CM | POA: Diagnosis not present

## 2022-07-06 DIAGNOSIS — I700 Atherosclerosis of aorta: Secondary | ICD-10-CM | POA: Diagnosis not present

## 2022-07-06 DIAGNOSIS — Z0000 Encounter for general adult medical examination without abnormal findings: Secondary | ICD-10-CM | POA: Diagnosis not present

## 2022-07-06 DIAGNOSIS — Z952 Presence of prosthetic heart valve: Secondary | ICD-10-CM | POA: Diagnosis not present

## 2022-07-06 DIAGNOSIS — R82998 Other abnormal findings in urine: Secondary | ICD-10-CM | POA: Diagnosis not present

## 2022-07-06 DIAGNOSIS — Z6841 Body Mass Index (BMI) 40.0 and over, adult: Secondary | ICD-10-CM | POA: Diagnosis not present

## 2022-07-06 DIAGNOSIS — I10 Essential (primary) hypertension: Secondary | ICD-10-CM | POA: Diagnosis not present

## 2022-07-06 DIAGNOSIS — I77810 Thoracic aortic ectasia: Secondary | ICD-10-CM | POA: Diagnosis not present

## 2022-07-06 DIAGNOSIS — E785 Hyperlipidemia, unspecified: Secondary | ICD-10-CM | POA: Diagnosis not present

## 2022-07-06 DIAGNOSIS — Z1331 Encounter for screening for depression: Secondary | ICD-10-CM | POA: Diagnosis not present

## 2022-07-06 DIAGNOSIS — E1159 Type 2 diabetes mellitus with other circulatory complications: Secondary | ICD-10-CM | POA: Diagnosis not present

## 2022-07-19 ENCOUNTER — Ambulatory Visit (INDEPENDENT_AMBULATORY_CARE_PROVIDER_SITE_OTHER): Payer: Medicare HMO | Admitting: Pharmacist

## 2022-07-19 ENCOUNTER — Other Ambulatory Visit: Payer: Self-pay | Admitting: Internal Medicine

## 2022-07-19 DIAGNOSIS — Z952 Presence of prosthetic heart valve: Secondary | ICD-10-CM

## 2022-07-19 DIAGNOSIS — Z7901 Long term (current) use of anticoagulants: Secondary | ICD-10-CM | POA: Diagnosis not present

## 2022-07-19 DIAGNOSIS — Z1231 Encounter for screening mammogram for malignant neoplasm of breast: Secondary | ICD-10-CM

## 2022-07-19 LAB — POCT INR: INR: 3.6 — AB (ref 2.0–3.0)

## 2022-07-19 NOTE — Patient Instructions (Signed)
Description   Spoke with pt and instructed pt to take just 1 tablet today and then continue taking 1.5 tablets of warfarin daily. Stay consistent with greens (daily).  Recheck INR in 2 weeks (normally 2 weeks-self tester). Coumadin Clinic [phone] or [phone].

## 2022-08-02 ENCOUNTER — Ambulatory Visit (INDEPENDENT_AMBULATORY_CARE_PROVIDER_SITE_OTHER): Payer: Medicare HMO | Admitting: *Deleted

## 2022-08-02 DIAGNOSIS — Z952 Presence of prosthetic heart valve: Secondary | ICD-10-CM | POA: Diagnosis not present

## 2022-08-02 DIAGNOSIS — Z7901 Long term (current) use of anticoagulants: Secondary | ICD-10-CM

## 2022-08-02 LAB — POCT INR: INR: 3.6 — AB (ref 2.0–3.0)

## 2022-08-02 NOTE — Patient Instructions (Addendum)
Description   Spoke with pt and instructed pt to take 1 tablet of warfarin today and then continue taking 1.5 tablets of warfarin daily. Eat another leafy green weekly and stay consistent with greens (daily). Recheck INR in 2 weeks (normally 2 weeks-self tester). Cou[facility]in Clinic [phone] or [phone].

## 2022-08-16 ENCOUNTER — Ambulatory Visit (INDEPENDENT_AMBULATORY_CARE_PROVIDER_SITE_OTHER): Payer: Medicare HMO

## 2022-08-16 DIAGNOSIS — Z952 Presence of prosthetic heart valve: Secondary | ICD-10-CM | POA: Diagnosis not present

## 2022-08-16 DIAGNOSIS — Z7901 Long term (current) use of anticoagulants: Secondary | ICD-10-CM | POA: Diagnosis not present

## 2022-08-16 LAB — POCT INR: INR: 2.9 (ref 2.0–3.0)

## 2022-08-16 NOTE — Patient Instructions (Signed)
Description   Spoke with pt and instructed pt to continue taking 1.5 tablets of warfarin daily.  Stay consistent with greens (daily).  Recheck INR in 2 weeks.  C[facility]in Clinic [phone] or [phone].

## 2022-08-23 ENCOUNTER — Ambulatory Visit: Payer: Medicare HMO | Admitting: Surgery

## 2022-08-30 ENCOUNTER — Encounter: Payer: Self-pay | Admitting: Surgery

## 2022-08-30 ENCOUNTER — Ambulatory Visit (INDEPENDENT_AMBULATORY_CARE_PROVIDER_SITE_OTHER): Payer: Medicare HMO

## 2022-08-30 ENCOUNTER — Ambulatory Visit: Payer: Medicare HMO | Admitting: Surgery

## 2022-08-30 ENCOUNTER — Ambulatory Visit
Admission: RE | Admit: 2022-08-30 | Discharge: 2022-08-30 | Disposition: A | Discharge status: Home / Self Care | Payer: Medicare HMO | Source: Ambulatory Visit | Attending: Surgery | Admitting: Surgery

## 2022-08-30 VITALS — BP 174/88 | HR 78 | Resp 18 | Ht 63.0 in | Wt 245.0 lb

## 2022-08-30 DIAGNOSIS — Z5181 Encounter for therapeutic drug level monitoring: Secondary | ICD-10-CM

## 2022-08-30 DIAGNOSIS — I7121 Aneurysm of the ascending aorta, without rupture: Secondary | ICD-10-CM | POA: Diagnosis not present

## 2022-08-30 LAB — POCT INR: INR: 3 (ref 2.0–3.0)

## 2022-08-30 MED ORDER — IOPAMIDOL (ISOVUE-370) INJECTION 76%
75.0000 mL | Freq: Once | INTRAVENOUS | Status: AC | PRN
  Administered 2022-08-30: 75 mL via INTRAVENOUS

## 2022-08-30 NOTE — Patient Instructions (Signed)
Description   Spoke with pt and instructed pt to continue taking 1.5 tablets of warfarin daily.  Stay consistent with greens (daily).  Recheck INR in 2 weeks.  Coumadin Clinic [phone] or [phone].       

## 2022-09-01 NOTE — Progress Notes (Signed)
HPI:  The patient is a 67 year old woman with a history of hypertension, hyperlipidemia, type II diabetes, morbid obesity, and degenerative joint disease of the knees who underwent aortic valve replacement in 2016 in [place] at [facility] for bicuspid aortic valve stenosis.  She said that her cardiac catheterization preoperatively was normal and she was never told about any problem with her aorta.  She was very symptomatic prior to her surgery and immediately improved and was back to normal activity.  She said that she had an echocardiogram for follow-up a few years later which she was told was normal.  She moved to [place] in 2019.  She has continued to feel well without shortness of breath or fatigue.  She has had no orthopnea or PND.  She denies peripheral edema.  She had a 2D echocardiogram in January 2021 which showed a mean gradient across aortic valve prosthesis of 41 mmHg with a peak gradient of 64 mmHg.  Left ventricular ejection fraction was 60 to 65% with moderate LVH.  She had a follow-up echocardiogram in January 2022 which showed the mean gradient across the aortic valve to be down slightly at 30.5 mmHg.  Peak gradient was 52.6 mmHg.  Left ventricular ejection fraction remained 60 to 65% with mild LVH and normal LV internal dimensions.  The ascending aorta was measured at 44 mm.  She had a gated cardiac CTA on 04/26/2020 which showed a high attenuation layer extending from the sewing ring to the leaflet base consistent with pannus.  There is no thrombus present.  The opening angle was 56.7 degrees with a normal closing angle of 123 degrees.  The sinuses of Valsalva measured 40 mm with the sinotubular junction of 41 mm and an ascending aorta of 48 mm.  Coronary artery origin was normal with right dominance.  Left ventricular ejection fraction was 72%.   When I saw her on 06/23/2020 she had normal left ventricular systolic function and normal internal LV dimensions and was active and  asymptomatic I thought it be best to continue following this.  She had a follow-up echocardiogram on 03/02/2021 showing the mean gradient across the [facility] mechanical aortic valve was 33 mmHg with a dimensionless index of 0.22 which were abnormal but essentially stable.  The ascending aorta was measured at 4.6 cm.  Left ventricular ejection fraction was 60 to 65% with mild LVH and grade 2 diastolic dysfunction with LV internal dimensions that were still within the normal range.  She continues to feel well and remains active without any symptoms at all.  Current Outpatient Medications  Medication Sig Dispense Refill   atorvastatin (LIPITOR) 40 MG tablet TAKE 1 TABLET BY MOUTH EVERY DAY 90 tablet 3   clindamycin (CLEOCIN) 300 MG capsule Take 300 mg by mouth 2 (two) times daily. Prior to dental procedure     lisinopril (ZESTRIL) 10 MG tablet Take 10 mg by mouth daily.     metoprolol succinate (TOPROL-XL) 25 MG 24 hr tablet Take 1 tablet (25 mg total) by mouth daily. 90 tablet 0   metoprolol tartrate (LOPRESSOR) 50 MG tablet Take 1 tablet 2 hours prior to CT Scan. 1 tablet 0   warfarin (COUMADIN) 5 MG tablet TAKE AS DIRECTED. 60 DAY SUPPLY 150 tablet 1   MOUNJARO 10 MG/0.5ML Pen SMARTSIG:10 Milligram(s) SUB-Q Once a Week     No current facility-administered medications for this visit.     Physical Exam: BP (!) 174/88 (BP Location: Right Arm, Patient Position:  Sitting)   Pulse 78   Resp 18   Ht 5\' 3"  (1.6 m)   Wt 245 lb (111.1 kg)   SpO2 98% Comment: RA  BMI 43.40 kg/m  She looks well. Cardiac exam shows a regular rate and rhythm with a crisp mechanical valve click.  There is a 2/6 systolic flow murmur along the right sternal border.  There is no diastolic murmur. Lungs are clear. There is no peripheral edema.  Diagnostic Tests:  ECHOCARDIOGRAM REPORT       Patient Name:   Denise Curtis Date of Exam: 03/08/2022  Medical Rec #:  [id number]        Height:       61.0 in  Accession  #:    [id number]       Weight:       245.0 lb  Date of Birth:  1955/04/10        BSA:          2.059 m  Patient Age:    66 years         BP:           128/76 mmHg  Patient Gender: F                HR:           81 bpm.  Exam Location:  [address]   Procedure: 2D Echo, Strain Analysis, Cardiac Doppler and Color Doppler   Indications:    I35.9 Aortic Valve Disorder    History:        Patient has prior history of Echocardiogram examinations,  most                 recent 03/02/2021. Aortic Valve Disease and Prosthetic  Valve                 Complications, Signs/Symptoms:Murmur; Risk Factors:Family                  History of Coronary Artery Disease, Hypertension, Diabetes  and                 Dyslipidemia. Obesity, Aortic Valve Replacement (2016, St  Jude                 Mechanical) now with increased gradients.                  Aortic Valve: 21 mm St. Jude mechanical valve is present  in the                  aortic position.    Sonographer:    Farrel Conners RDCS  Referring Phys: Corky Crafts   IMPRESSIONS     1. Left ventricular ejection fraction, by estimation, is 60 to 65%. The  left ventricle has normal function. The left ventricle has no regional  wall motion abnormalities. There is mild left ventricular hypertrophy.  Left ventricular diastolic parameters  are indeterminate. The average left ventricular global longitudinal strain  is -16.3 %. The global longitudinal strain is normal.   2. Right ventricular systolic function is normal. The right ventricular  size is normal.   3. Left atrial size was moderately dilated.   4. The mitral valve is degenerative. Trivial mitral valve regurgitation.  No evidence of mitral stenosis. Moderate to severe mitral annular  calcification.   5. The aortic valve has been repaired/replaced. Aortic valve  regurgitation is not visualized. There is a 21 mm St.  Jude mechanical  valve present in the aortic position. Aortic valve  area, by VTI measures  0.79 cm. Aortic valve mean gradient measures  23.5 mmHg. Aortic valve Vmax measures 3.25 m/s.   6. Aortic dilatation noted. There is moderate dilatation of the ascending  aorta, measuring 45 mm.   7. The inferior vena cava is normal in size with greater than 50%  respiratory variability, suggesting right atrial pressure of 3 mmHg.   Comparison(s): Changes from prior study are noted. Mechanical aortic  valve: Prior mean gradient of 33 and DI of 0.22. Improved on current  study.   FINDINGS   Left Ventricle: Left ventricular ejection fraction, by estimation, is 60  to 65%. The left ventricle has normal function. The left ventricle has no  regional wall motion abnormalities. The average left ventricular global  longitudinal strain is -16.3 %.  The global longitudinal strain is normal. The left ventricular internal  cavity size was normal in size. There is mild left ventricular  hypertrophy. Left ventricular diastolic parameters are indeterminate.   Right Ventricle: The right ventricular size is normal. Right vetricular  wall thickness was not well visualized. Right ventricular systolic  function is normal.   Left Atrium: Left atrial size was moderately dilated.   Right Atrium: Right atrial size was normal in size.   Pericardium: There is no evidence of pericardial effusion.   Mitral Valve: The mitral valve is degenerative in appearance. Moderate to  severe mitral annular calcification. Trivial mitral valve regurgitation.  No evidence of mitral valve stenosis.   Tricuspid Valve: The tricuspid valve is grossly normal. Tricuspid valve  regurgitation is trivial. No evidence of tricuspid stenosis.   Aortic Valve: The aortic valve has been repaired/replaced. Aortic valve  regurgitation is not visualized. Aortic valve mean gradient measures 23.5  mmHg. Aortic valve peak gradient measures 42.2 mmHg. Aortic valve area, by  VTI measures 0.79 cm. There is a   21 mm  St. Jude mechanical valve present in the aortic position.   Pulmonic Valve: The pulmonic valve was not well visualized. Pulmonic valve  regurgitation is not visualized.   Aorta: Aortic dilatation noted. There is moderate dilatation of the  ascending aorta, measuring 45 mm.   Venous: The inferior vena cava is normal in size with greater than 50%  respiratory variability, suggesting right atrial pressure of 3 mmHg.   IAS/Shunts: The atrial septum is grossly normal.     LEFT VENTRICLE  PLAX 2D  LVIDd:         3.20 cm   Diastology  LVIDs:         1.80 cm   LV e' medial:    5.87 cm/s  LV PW:         1.60 cm   LV E/e' medial:  13.1  LV IVS:        1.10 cm   LV e' lateral:   8.05 cm/s  LVOT diam:     2.00 cm   LV E/e' lateral: 9.5  LV SV:         51  LV SV Index:   25        2D Longitudinal Strain  LVOT Area:     3.14 cm  2D Strain GLS (A2C):   -18.6 %                           2D Strain GLS (A3C):   -12.4 %  2D Strain GLS (A4C):   -18.0 %                           2D Strain GLS Avg:     -16.3 %   RIGHT VENTRICLE  RV Basal diam:  3.70 cm  RV S prime:     168.50 cm/s  TAPSE (M-mode): 1.4 cm   LEFT ATRIUM             Index        RIGHT ATRIUM           Index  LA diam:        4.20 cm 2.04 cm/m   RA Area:     18.00 cm  LA Vol (A2C):   90.1 ml 43.76 ml/m  RA Volume:   47.60 ml  23.12 ml/m  LA Vol (A4C):   77.6 ml 37.69 ml/m  LA Biplane Vol: 84.3 ml 40.94 ml/m   AORTIC VALVE  AV Area (Vmax):    0.75 cm  AV Area (Vmean):   0.74 cm  AV Area (VTI):     0.79 cm  AV Vmax:           325.00 cm/s  AV Vmean:          225.750 cm/s  AV VTI:            0.642 m  AV Peak Grad:      42.2 mmHg  AV Mean Grad:      23.5 mmHg  LVOT Vmax:         77.45 cm/s  LVOT Vmean:        53.450 cm/s  LVOT VTI:          0.162 m  LVOT/AV VTI ratio: 0.25    AORTA  Ao Root diam: 3.50 cm  Ao Asc diam:  4.45 cm   MITRAL VALVE  MV Area (PHT)  cm         SHUNTS  MV Decel  Time: 207 msec    Systemic VTI:  0.16 m  MV E velocity: 76.80 cm/s  Systemic Diam: 2.00 cm  MV A velocity: 83.00 cm/s  MV E/A ratio:  0.93   Jodelle Red MD  Electronically signed by Jodelle Red MD  Signature Date/Time: 03/08/2022/3:59:18 PM        Final      Narrative & Impression CLINICAL DATA:  ascending aortic aneurysm   EXAM: CT ANGIOGRAPHY CHEST WITH CONTRAST   TECHNIQUE: Multidetector CT imaging of the chest was performed using the standard protocol during bolus administration of intravenous contrast. Multiplanar CT image reconstructions and MIPs were obtained to evaluate the vascular anatomy.   RADIATION DOSE REDUCTION: This exam was performed according to the departmental dose-optimization program which includes automated exposure control, adjustment of the mA and/or kV according to patient size and/or use of iterative reconstruction technique.   CONTRAST:  75mL ISOVUE-370 IOPAMIDOL (ISOVUE-370) INJECTION 76%   COMPARISON:  08/17/2021, 04/26/2020   FINDINGS: Cardiovascular: Satisfactory opacification of the thoracic aorta. Prior aortic valve replacement. Ascending thoracic aortic aneurysm measuring up to 4.6 cm in diameter. Scattered atherosclerotic vascular calcifications of the aorta and coronary arteries. Central pulmonary vasculature is within normal limits. Normal heart size. No pericardial effusion.   Mediastinum/Nodes: No enlarged mediastinal, hilar, or axillary lymph nodes. The trachea and esophagus demonstrate no significant findings. Multinodular thyroid gland, recently assessed by dedicated ultrasound.   Lungs/Pleura: Lungs are clear. Stable  6 mm nodule along the left major fissure (series 6, image 76). No pleural effusion or pneumothorax.   Upper Abdomen: No acute abnormality.   Musculoskeletal: Prior median sternotomy. No new or acute bony abnormality. No chest wall abnormality.   Review of the MIP images confirms the  above findings.   IMPRESSION: 1. Stable ascending thoracic aortic aneurysm measuring up to 4.6 cm in diameter. Recommend semi-annual imaging followup by CTA or MRA. This recommendation follows 2010 ACCF/AHA/AATS/ACR/ASA/SCA/SCAI/SIR/STS/SVM Guidelines for the Diagnosis and Management of Patients With Thoracic Aortic Disease. Circulation. 2010; 121: W098-J191. Aortic aneurysm NOS (ICD10-I71.9) 2. Stable 6 mm nodule along the left major fissure. No follow-up imaging recommended. 3. Aortic atherosclerosis (ICD10-I70.0).     Electronically Signed   By: Duanne Guess D.O.   On: 08/30/2022 12:18   Impression:  She is doing well clinically and asymptomatic.  Her follow-up echo from January 2024 showed a decrease in the mean gradient across the aortic valve prosthesis to 23.5 mmHg from the previous study when it was 33 mmHg.  Left ventricular systolic function remains normal.  There is mild LVH.  Her echocardiogram shows an ascending aortic diameter of 4.5 cm and CTA of the chest shows the diameter to be 4.6 cm.  This is still below the surgical threshold and appears stable.  I have recommended continued follow-up with yearly echocardiogram and CT of the chest.  I do not think there is any indication for replacing her aortic valve prosthesis or her ascending aorta at this time.  Plan:  She will continue to follow-up with cardiology and I will see her back in 1 year with a CTA of the chest for aortic surveillance.  I spent 20 minutes performing this established patient evaluation and > 50% of this time was spent face to face counseling and coordinating the care of this patient's aortic aneurysm.    Alleen Borne, MD [facility] [phone]

## 2022-09-04 ENCOUNTER — Ambulatory Visit: Admission: RE | Admit: 2022-09-04 | Payer: Medicare HMO | Source: Ambulatory Visit

## 2022-09-04 DIAGNOSIS — Z1231 Encounter for screening mammogram for malignant neoplasm of breast: Secondary | ICD-10-CM

## 2022-09-13 ENCOUNTER — Ambulatory Visit (INDEPENDENT_AMBULATORY_CARE_PROVIDER_SITE_OTHER): Payer: Medicare HMO

## 2022-09-13 DIAGNOSIS — Z5181 Encounter for therapeutic drug level monitoring: Secondary | ICD-10-CM

## 2022-09-13 LAB — POCT INR: INR: 3.3 — AB (ref 2.0–3.0)

## 2022-09-13 NOTE — Patient Instructions (Signed)
Description   Spoke with pt and instructed pt to continue taking 1.5 tablets of warfarin daily.  Stay consistent with greens (daily).  Recheck INR in 2 weeks.  C[facility]in Clinic [phone] or [phone].

## 2022-09-14 ENCOUNTER — Other Ambulatory Visit: Payer: Self-pay | Admitting: Interventional Cardiology

## 2022-09-14 DIAGNOSIS — E1169 Type 2 diabetes mellitus with other specified complication: Secondary | ICD-10-CM

## 2022-09-17 ENCOUNTER — Telehealth: Payer: Self-pay | Admitting: Student

## 2022-09-17 NOTE — Telephone Encounter (Signed)
Telephone counter:  Patient reports she may have taken double dose of medications by mistake. Per chart review, on lisinopril 10 mg, warfarin 7.5 mg and metoprolol 25 mg XL. May have taken pills at both 5PM and 8:30PM.   Recommendations: #. Anticoagulation with warfarin Patient takes 7.5 mg once a day for St. Jude mechanical mitral valve - She will check her INR on her home machine. If INR >5, have advised patient to skip tomorrow's dose of warfarin. If INR <5, have advised patient to continue taking her warfarin as prescribed  #Hypertension With regards to blood pressure pills, patient does not feel dizzy/lightheaded, so not concerned about hypotension.  - Have advised patient to continue taking her BP meds as normal  Willette Alma MD MPH [facility]

## 2022-09-18 ENCOUNTER — Ambulatory Visit (INDEPENDENT_AMBULATORY_CARE_PROVIDER_SITE_OTHER): Payer: Medicare HMO

## 2022-09-18 DIAGNOSIS — Z5181 Encounter for therapeutic drug level monitoring: Secondary | ICD-10-CM | POA: Diagnosis not present

## 2022-09-18 LAB — POCT INR: INR: 4.4 — AB (ref 2.0–3.0)

## 2022-09-18 NOTE — Patient Instructions (Signed)
Description   Spoke with pt and instructed pt to HOLD today's dose and then continue taking 1.5 tablets of warfarin daily.  Stay consistent with greens (daily).  Recheck INR on 09/27/22 [facility] [phone] or [phone].

## 2022-09-27 ENCOUNTER — Ambulatory Visit: Payer: Self-pay

## 2022-09-27 DIAGNOSIS — Z5181 Encounter for therapeutic drug level monitoring: Secondary | ICD-10-CM

## 2022-09-27 LAB — POCT INR: INR: 3 (ref 2.0–3.0)

## 2022-09-27 NOTE — Patient Instructions (Signed)
Description   Spoke with pt and instructed to continue taking 1.5 tablets of warfarin daily.  Stay consistent with greens (daily).  Recheck INR in 2 weeks.  [facility] [phone] or [phone].

## 2022-10-10 DIAGNOSIS — Z6841 Body Mass Index (BMI) 40.0 and over, adult: Secondary | ICD-10-CM | POA: Diagnosis not present

## 2022-10-10 DIAGNOSIS — M170 Bilateral primary osteoarthritis of knee: Secondary | ICD-10-CM | POA: Diagnosis not present

## 2022-10-10 DIAGNOSIS — M7052 Other bursitis of knee, left knee: Secondary | ICD-10-CM | POA: Diagnosis not present

## 2022-10-11 ENCOUNTER — Ambulatory Visit (INDEPENDENT_AMBULATORY_CARE_PROVIDER_SITE_OTHER): Payer: Medicare HMO | Admitting: *Deleted

## 2022-10-11 DIAGNOSIS — Z952 Presence of prosthetic heart valve: Secondary | ICD-10-CM | POA: Diagnosis not present

## 2022-10-11 DIAGNOSIS — Z7901 Long term (current) use of anticoagulants: Secondary | ICD-10-CM

## 2022-10-11 LAB — POCT INR: INR: 3 (ref 2.0–3.0)

## 2022-10-11 NOTE — Patient Instructions (Signed)
 Description   Spoke with pt and instructed to continue taking 1.5 tablets of warfarin daily.  Stay consistent with greens (daily).  Recheck INR in 2 weeks.  [facility] [phone] or [phone].

## 2022-10-25 ENCOUNTER — Ambulatory Visit (INDEPENDENT_AMBULATORY_CARE_PROVIDER_SITE_OTHER): Payer: Medicare HMO

## 2022-10-25 DIAGNOSIS — Z5181 Encounter for therapeutic drug level monitoring: Secondary | ICD-10-CM

## 2022-10-25 LAB — POCT INR: INR: 3.3 — AB (ref 2.0–3.0)

## 2022-10-25 NOTE — Patient Instructions (Signed)
 Description   Spoke with pt and instructed to continue taking 1.5 tablets of warfarin daily.  Stay consistent with greens (daily).  Recheck INR in 2 weeks.  [facility] [phone] or [phone].

## 2022-11-08 ENCOUNTER — Ambulatory Visit (INDEPENDENT_AMBULATORY_CARE_PROVIDER_SITE_OTHER): Payer: Medicare HMO | Admitting: Cardiovascular Disease

## 2022-11-08 DIAGNOSIS — Z5181 Encounter for therapeutic drug level monitoring: Secondary | ICD-10-CM

## 2022-11-08 LAB — POCT INR: INR: 3.6 — AB (ref 2.0–3.0)

## 2022-11-08 NOTE — Patient Instructions (Addendum)
Description   Called and spoke with pt and instructed her to take 1 tablet of warfarin today and then continue taking warfarin 1.5 tablets of warfarin daily.  Stay consistent with greens (daily).  Recheck INR in 2 weeks.  [facility] [phone] or [phone].

## 2022-11-22 ENCOUNTER — Ambulatory Visit (INDEPENDENT_AMBULATORY_CARE_PROVIDER_SITE_OTHER): Payer: Self-pay

## 2022-11-22 DIAGNOSIS — Z5181 Encounter for therapeutic drug level monitoring: Secondary | ICD-10-CM | POA: Diagnosis not present

## 2022-11-22 LAB — POCT INR: INR: 4 — AB (ref 2.0–3.0)

## 2022-11-22 NOTE — Patient Instructions (Signed)
Description   Called and spoke with pt and instructed her to HOLD today's dose and then continue taking warfarin 1.5 tablets of warfarin daily.  Stay consistent with greens (daily).  Recheck INR in 2 weeks.  [facility] [phone] or [phone].

## 2022-12-06 ENCOUNTER — Ambulatory Visit (INDEPENDENT_AMBULATORY_CARE_PROVIDER_SITE_OTHER): Payer: Medicare HMO

## 2022-12-06 DIAGNOSIS — Z7901 Long term (current) use of anticoagulants: Secondary | ICD-10-CM | POA: Diagnosis not present

## 2022-12-06 DIAGNOSIS — Z5181 Encounter for therapeutic drug level monitoring: Secondary | ICD-10-CM

## 2022-12-06 DIAGNOSIS — Z952 Presence of prosthetic heart valve: Secondary | ICD-10-CM | POA: Diagnosis not present

## 2022-12-06 LAB — POCT INR: INR: 3 (ref 2.0–3.0)

## 2022-12-06 NOTE — Patient Instructions (Signed)
Description   Called and spoke with pt and instructed her to continue taking warfarin 1.5 tablets of warfarin daily.  Stay consistent with greens (daily).  Recheck INR in 2 weeks.  [facility] [phone]

## 2022-12-08 IMAGING — MG MM DIGITAL SCREENING BILAT W/ TOMO AND CAD
6 of 12 series · 6 of 36 positions shown · non-contrast
Comparison: Previous exam(s).

CLINICAL DATA: Screening.

EXAM:
DIGITAL SCREENING BILATERAL MAMMOGRAM WITH TOMOSYNTHESIS AND CAD
TECHNIQUE: Bilateral screening digital craniocaudal and mediolateral oblique
mammograms were obtained. Bilateral screening digital breast
tomosynthesis was performed. The images were evaluated with
computer-aided detection.

[R MLO synth-2D (1 of 2)]
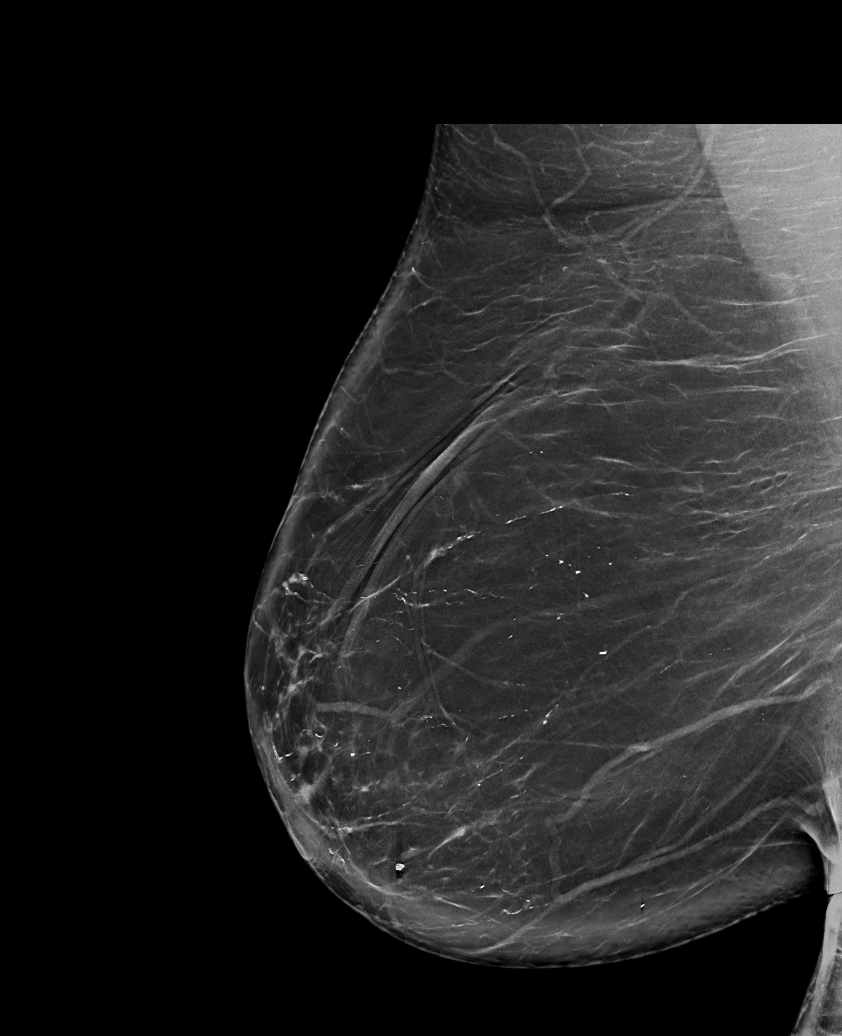

[R CC synth-2D]
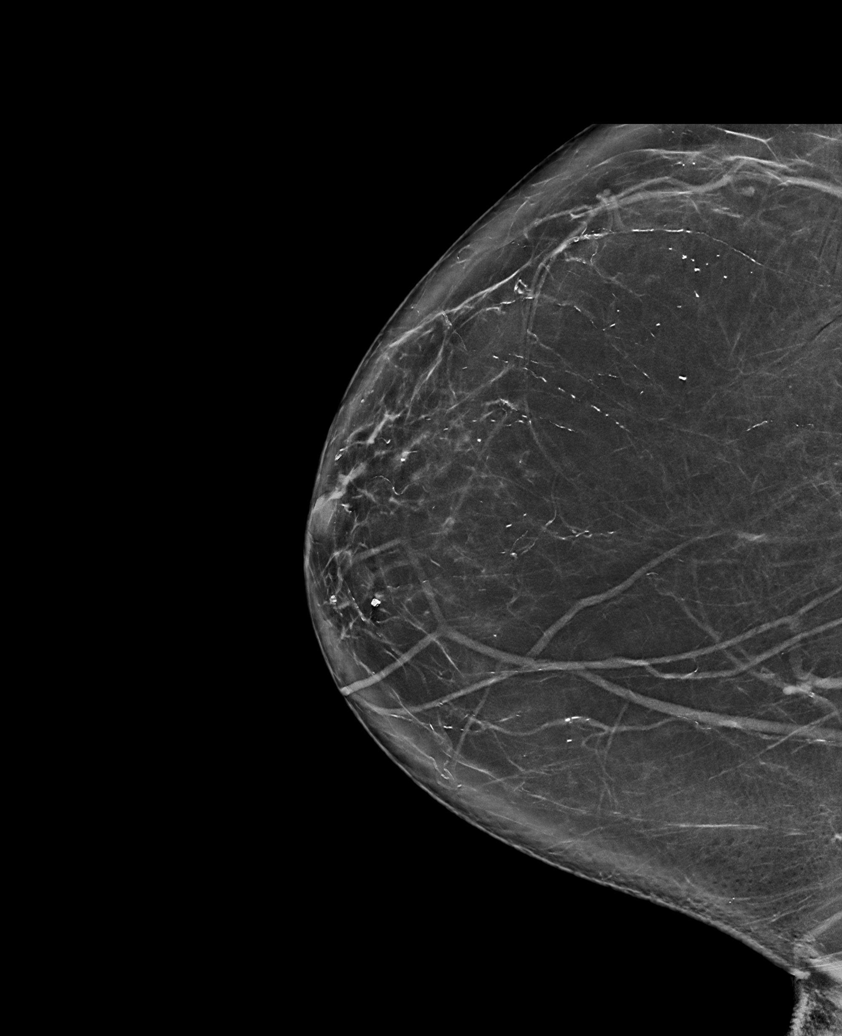

[L MLO synth-2D]
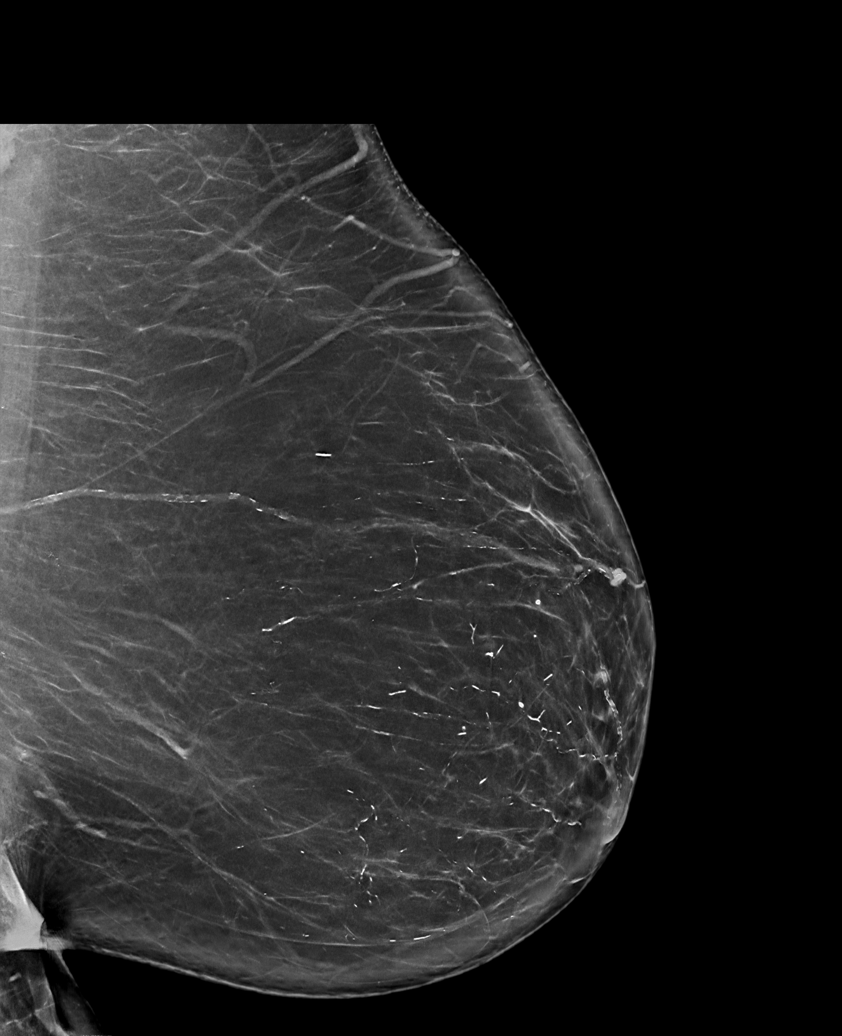

[L CC synth-2D]
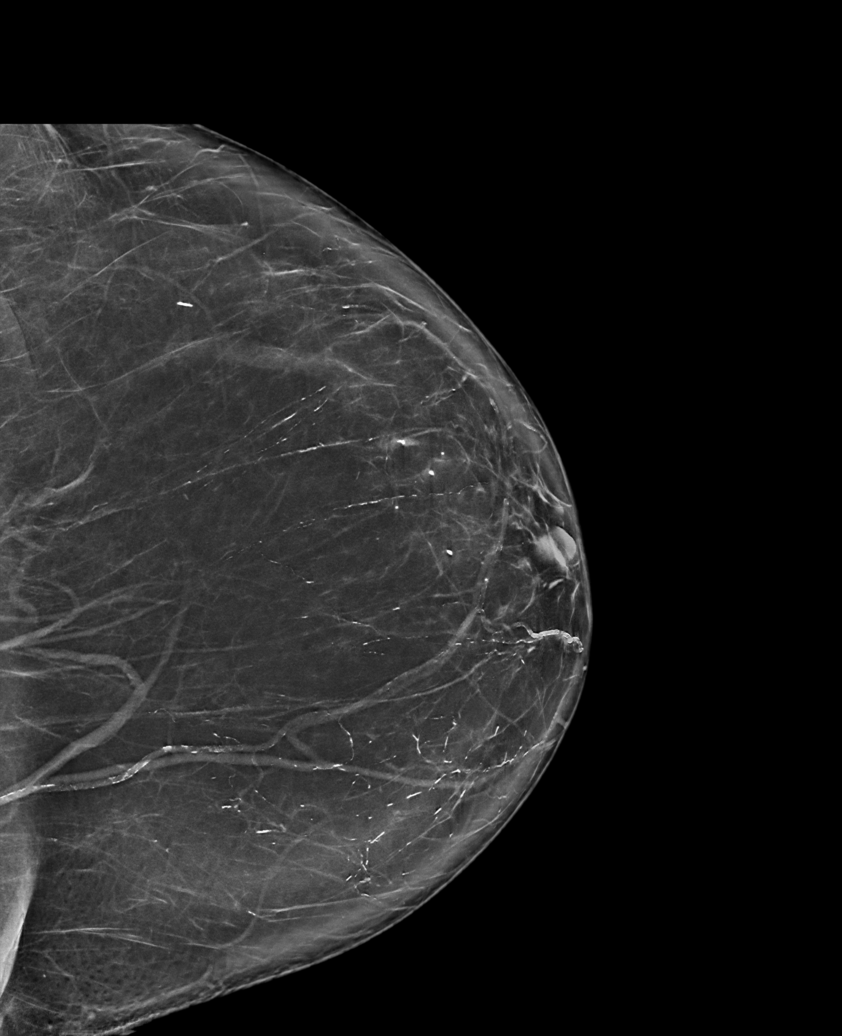

[R CV synth-2D]
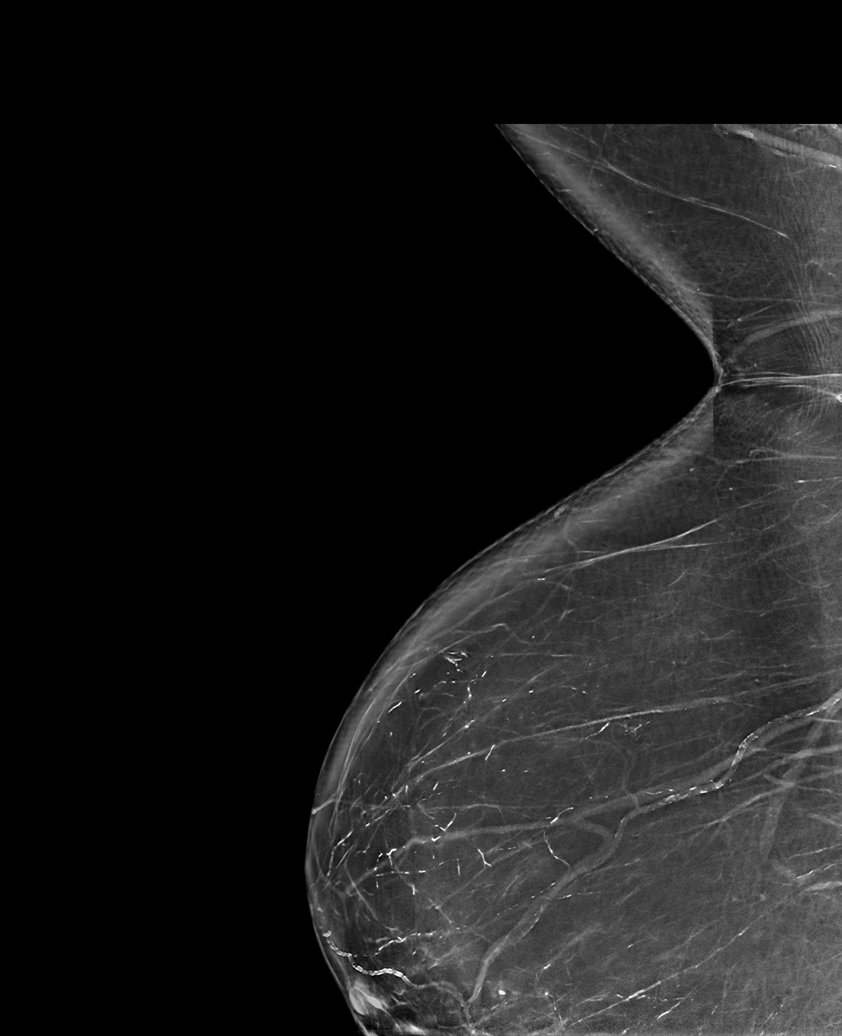

[R MLO synth-2D (2 of 2)]
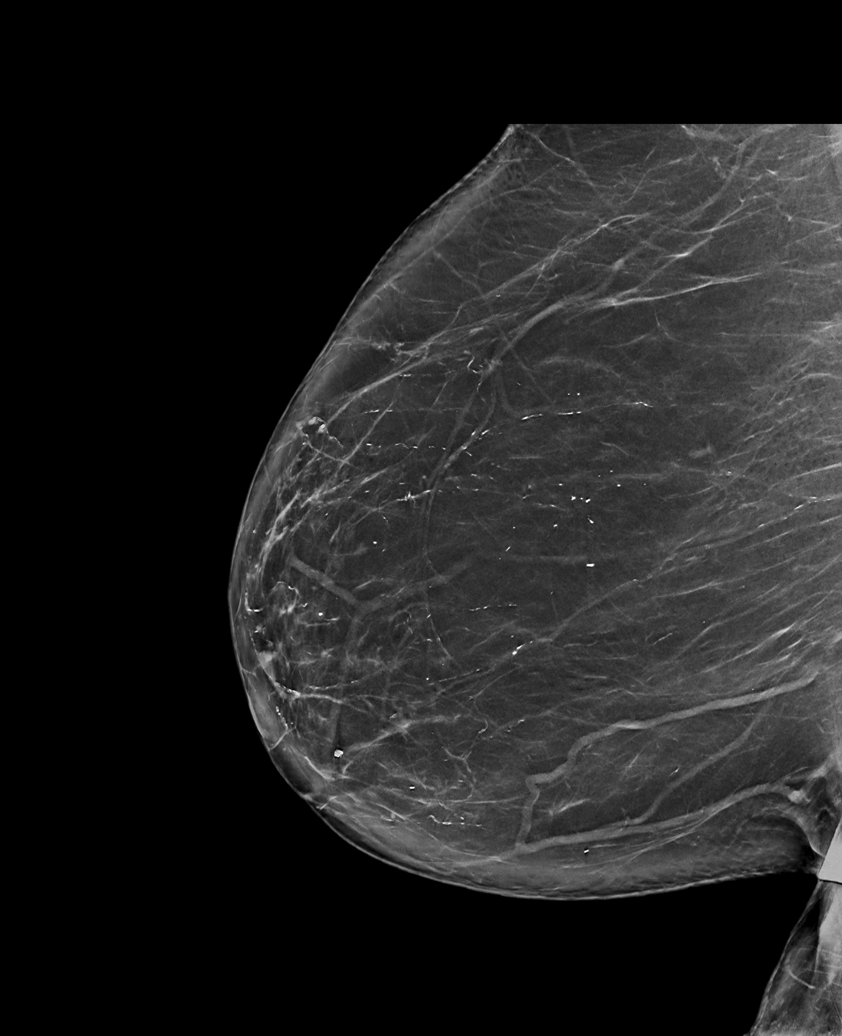

[6 of 36 positions shown; findings below may reference images not displayed]

ACR Breast Density Category b: There are scattered areas of
fibroglandular density.
FINDINGS: There are no findings suspicious for malignancy. The images were
evaluated with computer-aided detection.
IMPRESSION: No mammographic evidence of malignancy. A result letter of this
screening mammogram will be mailed directly to the patient.

RECOMMENDATION:
Screening mammogram in one year. (Code:[id number])

BI-RADS CATEGORY  1: Negative.

## 2022-12-19 DIAGNOSIS — L570 Actinic keratosis: Secondary | ICD-10-CM | POA: Diagnosis not present

## 2022-12-19 DIAGNOSIS — L821 Other seborrheic keratosis: Secondary | ICD-10-CM | POA: Diagnosis not present

## 2022-12-19 DIAGNOSIS — L578 Other skin changes due to chronic exposure to nonionizing radiation: Secondary | ICD-10-CM | POA: Diagnosis not present

## 2022-12-19 DIAGNOSIS — L814 Other melanin hyperpigmentation: Secondary | ICD-10-CM | POA: Diagnosis not present

## 2022-12-19 DIAGNOSIS — E1159 Type 2 diabetes mellitus with other circulatory complications: Secondary | ICD-10-CM | POA: Diagnosis not present

## 2022-12-19 DIAGNOSIS — D225 Melanocytic nevi of trunk: Secondary | ICD-10-CM | POA: Diagnosis not present

## 2022-12-20 ENCOUNTER — Ambulatory Visit (INDEPENDENT_AMBULATORY_CARE_PROVIDER_SITE_OTHER): Payer: Medicare HMO

## 2022-12-20 DIAGNOSIS — Z7901 Long term (current) use of anticoagulants: Secondary | ICD-10-CM | POA: Diagnosis not present

## 2022-12-20 DIAGNOSIS — Z952 Presence of prosthetic heart valve: Secondary | ICD-10-CM

## 2022-12-20 LAB — POCT INR: INR: 3.6 — AB (ref 2.0–3.0)

## 2022-12-20 NOTE — Patient Instructions (Signed)
Description   Called and spoke with pt. Instructed to only take 1 tablet today and then continue taking warfarin 1.5 tablets of warfarin daily.  Stay consistent with greens (daily).  Recheck INR in 2 weeks.  [facility] [phone]

## 2023-01-03 ENCOUNTER — Ambulatory Visit (INDEPENDENT_AMBULATORY_CARE_PROVIDER_SITE_OTHER): Payer: Medicare HMO | Admitting: *Deleted

## 2023-01-03 DIAGNOSIS — Z7901 Long term (current) use of anticoagulants: Secondary | ICD-10-CM

## 2023-01-03 DIAGNOSIS — Z952 Presence of prosthetic heart valve: Secondary | ICD-10-CM | POA: Diagnosis not present

## 2023-01-03 LAB — POCT INR: INR: 3.6 — AB (ref 2.0–3.0)

## 2023-01-03 NOTE — Patient Instructions (Signed)
Description   Called and spoke with pt and instructed to only take 1 tablet today and then START taking warfarin 1.5 tablets of warfarin daily except 1 tablet on Wednesdays.  Stay consistent with greens (daily).  Recheck INR in 2 weeks.  [facility] [phone]

## 2023-01-16 ENCOUNTER — Ambulatory Visit (INDEPENDENT_AMBULATORY_CARE_PROVIDER_SITE_OTHER): Payer: Self-pay | Admitting: Internal Medicine

## 2023-01-16 DIAGNOSIS — Z7901 Long term (current) use of anticoagulants: Secondary | ICD-10-CM

## 2023-01-16 DIAGNOSIS — Z952 Presence of prosthetic heart valve: Secondary | ICD-10-CM | POA: Diagnosis not present

## 2023-01-16 LAB — POCT INR: INR: 3.5 — AB (ref 2.0–3.0)

## 2023-01-16 NOTE — Progress Notes (Signed)
This encounter was created in error - please disregard.

## 2023-01-31 ENCOUNTER — Ambulatory Visit (INDEPENDENT_AMBULATORY_CARE_PROVIDER_SITE_OTHER): Payer: Medicare HMO

## 2023-01-31 DIAGNOSIS — Z7901 Long term (current) use of anticoagulants: Secondary | ICD-10-CM | POA: Diagnosis not present

## 2023-01-31 DIAGNOSIS — Z5181 Encounter for therapeutic drug level monitoring: Secondary | ICD-10-CM

## 2023-01-31 DIAGNOSIS — Z952 Presence of prosthetic heart valve: Secondary | ICD-10-CM | POA: Diagnosis not present

## 2023-01-31 LAB — POCT INR: INR: 3 (ref 2.0–3.0)

## 2023-01-31 NOTE — Patient Instructions (Signed)
Description   Called and spoke with pt and instructed to continue taking warfarin 1.5 tablets of warfarin daily except 1 tablet on Wednesdays.  Stay consistent with greens (daily).  Recheck INR in 2 weeks.  [facility] [phone]

## 2023-02-12 ENCOUNTER — Encounter: Payer: Self-pay | Admitting: Internal Medicine

## 2023-02-13 ENCOUNTER — Ambulatory Visit (INDEPENDENT_AMBULATORY_CARE_PROVIDER_SITE_OTHER): Payer: Self-pay | Admitting: Cardiovascular Disease

## 2023-02-13 ENCOUNTER — Encounter: Payer: Self-pay | Admitting: Pharmacist

## 2023-02-13 DIAGNOSIS — Z5181 Encounter for therapeutic drug level monitoring: Secondary | ICD-10-CM | POA: Diagnosis not present

## 2023-02-13 DIAGNOSIS — Z952 Presence of prosthetic heart valve: Secondary | ICD-10-CM | POA: Diagnosis not present

## 2023-02-13 DIAGNOSIS — Z7901 Long term (current) use of anticoagulants: Secondary | ICD-10-CM

## 2023-02-13 LAB — POCT INR
INR: 4 — AB (ref 2.0–3.0)
INR: 4 — AB (ref 2.0–3.0)

## 2023-02-13 NOTE — Progress Notes (Signed)
This encounter was created in error - please disregard.

## 2023-02-28 ENCOUNTER — Ambulatory Visit (INDEPENDENT_AMBULATORY_CARE_PROVIDER_SITE_OTHER): Payer: Medicare Other | Admitting: Cardiology

## 2023-02-28 DIAGNOSIS — Z7901 Long term (current) use of anticoagulants: Secondary | ICD-10-CM

## 2023-02-28 DIAGNOSIS — Z952 Presence of prosthetic heart valve: Secondary | ICD-10-CM

## 2023-02-28 LAB — POCT INR: INR: 2.3 (ref 2.0–3.0)

## 2023-02-28 NOTE — Patient Instructions (Signed)
 Description   Called and spoke with pt and instructed to take 1.5 tablets today and then continue taking warfarin 1.5 tablets of warfarin daily except 1 tablet on Wednesdays.  Stay consistent with greens (daily).  Recheck INR in 2 weeks.  [facility] [phone]

## 2023-03-05 ENCOUNTER — Ambulatory Visit (HOSPITAL_COMMUNITY): Payer: Medicare Other | Attending: Cardiology

## 2023-03-05 DIAGNOSIS — Z952 Presence of prosthetic heart valve: Secondary | ICD-10-CM | POA: Diagnosis present

## 2023-03-05 DIAGNOSIS — I77810 Thoracic aortic ectasia: Secondary | ICD-10-CM | POA: Diagnosis present

## 2023-03-05 LAB — ECHOCARDIOGRAM COMPLETE
AV Mean grad: 23 mmHg
AV Peak grad: 42.9 mmHg
Ao pk vel: 3.28 m/s
Area-P 1/2: 3.85 cm2
S' Lateral: 3 cm

## 2023-03-12 ENCOUNTER — Telehealth: Payer: Self-pay | Admitting: Cardiology

## 2023-03-12 NOTE — Telephone Encounter (Signed)
Left detailed message that patient will need to see her new cardiologist first an order can be placed. Patient has follow-up with Dr. Anne Fu and will have him decide at the office visit how often he will get her echo done.

## 2023-03-12 NOTE — Telephone Encounter (Signed)
Patient is requesting call back to schedule echo for 02/2024. She states they usually schedule in advance for that appt, but would need orders. Past patient of Dr. Eldridge Dace.

## 2023-03-14 ENCOUNTER — Ambulatory Visit (INDEPENDENT_AMBULATORY_CARE_PROVIDER_SITE_OTHER): Payer: Medicare Other | Admitting: Cardiology

## 2023-03-14 DIAGNOSIS — Z952 Presence of prosthetic heart valve: Secondary | ICD-10-CM

## 2023-03-14 DIAGNOSIS — Z7901 Long term (current) use of anticoagulants: Secondary | ICD-10-CM | POA: Diagnosis not present

## 2023-03-14 LAB — POCT INR: INR: 2.1 (ref 2.0–3.0)

## 2023-03-14 NOTE — Patient Instructions (Signed)
Description   Called and spoke with pt and instructed to take 2 tablets today and then START taking warfarin 1.5 tablets of warfarin daily.  Stay consistent with greens (daily).  Recheck INR in 2 weeks.  [facility] [phone]

## 2023-03-19 ENCOUNTER — Other Ambulatory Visit (HOSPITAL_COMMUNITY): Payer: Medicare HMO

## 2023-03-28 ENCOUNTER — Ambulatory Visit (INDEPENDENT_AMBULATORY_CARE_PROVIDER_SITE_OTHER): Payer: Medicare Other | Admitting: Internal Medicine

## 2023-03-28 DIAGNOSIS — Z952 Presence of prosthetic heart valve: Secondary | ICD-10-CM

## 2023-03-28 DIAGNOSIS — Z7901 Long term (current) use of anticoagulants: Secondary | ICD-10-CM

## 2023-03-28 LAB — POCT INR: INR: 3.2 — AB (ref 2.0–3.0)

## 2023-03-28 NOTE — Patient Instructions (Signed)
Description   Called and spoke with pt and instructed to continue taking warfarin 1.5 tablets of warfarin daily.  Stay consistent with greens (daily).  Recheck INR in 2 weeks.  [facility] [phone]

## 2023-04-11 ENCOUNTER — Ambulatory Visit (INDEPENDENT_AMBULATORY_CARE_PROVIDER_SITE_OTHER): Payer: Self-pay | Admitting: *Deleted

## 2023-04-11 DIAGNOSIS — Z952 Presence of prosthetic heart valve: Secondary | ICD-10-CM

## 2023-04-11 DIAGNOSIS — Z7901 Long term (current) use of anticoagulants: Secondary | ICD-10-CM

## 2023-04-11 LAB — POCT INR: INR: 2.8 (ref 2.0–3.0)

## 2023-04-11 NOTE — Patient Instructions (Signed)
 Description   Called and spoke with pt and instructed to continue taking warfarin 1.5 tablets of warfarin daily.  Stay consistent with greens (daily).  Recheck INR in 2 weeks.  [facility] [phone]

## 2023-04-25 ENCOUNTER — Ambulatory Visit (INDEPENDENT_AMBULATORY_CARE_PROVIDER_SITE_OTHER): Payer: Self-pay

## 2023-04-25 DIAGNOSIS — Z5181 Encounter for therapeutic drug level monitoring: Secondary | ICD-10-CM | POA: Diagnosis not present

## 2023-04-25 LAB — POCT INR: INR: 2 (ref 2.0–3.0)

## 2023-04-25 NOTE — Patient Instructions (Signed)
 Description   Called and spoke with pt and instructed to take 2 tablets today and then continue taking warfarin 1.5 tablets of warfarin daily.  Stay consistent with greens (daily).  Recheck INR in 2 weeks.  [facility] [phone]

## 2023-05-09 ENCOUNTER — Ambulatory Visit (INDEPENDENT_AMBULATORY_CARE_PROVIDER_SITE_OTHER): Admitting: *Deleted

## 2023-05-09 DIAGNOSIS — Z7901 Long term (current) use of anticoagulants: Secondary | ICD-10-CM

## 2023-05-09 DIAGNOSIS — Z952 Presence of prosthetic heart valve: Secondary | ICD-10-CM

## 2023-05-09 LAB — POCT INR: INR: 2.5 (ref 2.0–3.0)

## 2023-05-09 NOTE — Patient Instructions (Signed)
 Description   Called and spoke with pt and instructed to continue taking warfarin 1.5 tablets of warfarin daily.  Stay consistent with greens (daily).  Recheck INR in 2 weeks. ([place] pt pending appt with [facility] on 07/06/23) [facility] [phone]

## 2023-05-17 ENCOUNTER — Encounter: Payer: Self-pay | Admitting: Nurse Practitioner

## 2023-05-17 ENCOUNTER — Other Ambulatory Visit: Payer: Self-pay | Admitting: Internal Medicine

## 2023-05-17 DIAGNOSIS — Z1231 Encounter for screening mammogram for malignant neoplasm of breast: Secondary | ICD-10-CM

## 2023-05-23 ENCOUNTER — Ambulatory Visit (INDEPENDENT_AMBULATORY_CARE_PROVIDER_SITE_OTHER)

## 2023-05-23 DIAGNOSIS — Z5181 Encounter for therapeutic drug level monitoring: Secondary | ICD-10-CM

## 2023-05-23 LAB — POCT INR: INR: 2.5 (ref 2.0–3.0)

## 2023-05-23 NOTE — Patient Instructions (Signed)
 Description   Called and spoke with pt and instructed to take 2 tablets today and then continue taking warfarin 1.5 tablets of warfarin daily.  Stay consistent with greens (daily).  Recheck INR in 2 weeks. ([place] pt pending appt with [facility] on 07/06/23) [facility] [phone]

## 2023-06-05 ENCOUNTER — Ambulatory Visit (HOSPITAL_BASED_OUTPATIENT_CLINIC_OR_DEPARTMENT_OTHER)
Admission: EM | Admit: 2023-06-05 | Discharge: 2023-06-05 | Disposition: A | Discharge status: Home / Self Care | Attending: Emergency Medicine | Admitting: Emergency Medicine

## 2023-06-05 ENCOUNTER — Encounter (HOSPITAL_BASED_OUTPATIENT_CLINIC_OR_DEPARTMENT_OTHER): Payer: Self-pay

## 2023-06-05 ENCOUNTER — Other Ambulatory Visit (HOSPITAL_BASED_OUTPATIENT_CLINIC_OR_DEPARTMENT_OTHER): Payer: Self-pay

## 2023-06-05 DIAGNOSIS — L255 Unspecified contact dermatitis due to plants, except food: Secondary | ICD-10-CM | POA: Diagnosis not present

## 2023-06-05 DIAGNOSIS — E119 Type 2 diabetes mellitus without complications: Secondary | ICD-10-CM

## 2023-06-05 DIAGNOSIS — L299 Pruritus, unspecified: Secondary | ICD-10-CM | POA: Diagnosis not present

## 2023-06-05 LAB — POCT FASTING CBG KUC MANUAL ENTRY: POCT Glucose (KUC): 106 mg/dL — AB (ref 70–99)

## 2023-06-05 MED ORDER — METHYLPREDNISOLONE ACETATE 40 MG/ML IJ SUSP
40.0000 mg | Freq: Once | INTRAMUSCULAR | Status: AC
  Administered 2023-06-05: 40 mg via INTRAMUSCULAR

## 2023-06-05 MED ORDER — PREDNISONE 10 MG (21) PO TBPK
ORAL_TABLET | Freq: Every day | ORAL | 0 refills | Status: DC
Start: 2023-06-06 — End: 2023-09-03
  Filled 2023-06-05: qty 21, 6d supply, fill #0

## 2023-06-05 NOTE — Discharge Instructions (Addendum)
 Steroid injection today will start to work in about 30 minutes to reduce itch.  Starting tomorrow morning, take the prednisone taper. 6 pills on day 1 5 pills on day 2 4 pills on day 3, etc. Continue until all pills are gone! Please monitor blood sugar while taking the prednisone as your sugars WILL be higher!

## 2023-06-05 NOTE — ED Provider Notes (Signed)
 [facility]    CSN: [id number] Arrival date & time: 06/05/23  1127      History   Chief Complaint Chief Complaint  Patient presents with   Rash    HPI LEATHER ESTIS is a 68 y.o. female.  Reports was doing some yard work yesterday, may have come into contact with poison ivy.  Last night was feeling okay, this morning woke up with rash and itching on her eyelids, chin, neck and left arm.  Denies any shortness of breath, wheezing, swelling of lips or tongue.  History of diabetes.  Reports last A1c was 5.9  Past Medical History:  Diagnosis Date   Arthritis    knee   Diabetes mellitus type II, non insulin dependent (HCC)    Heart murmur    Hyperlipidemia    Hypertension    Migraines     Patient Active Problem List   Diagnosis Date Noted   Aortic valve replaced 09/09/2019   Morbid obesity (HCC) 03/06/2019   Chronic pain of right knee 03/06/2019   Hyperlipidemia associated with type 2 diabetes mellitus (HCC) 03/06/2019   Hypertension associated with diabetes (HCC) 03/06/2019   Cough 04/22/2018   Long term (current) use of anticoagulants 04/15/2018   Healthcare maintenance 03/04/2018   Trigger finger of left thumb 03/04/2018   Diabetes mellitus type II, non insulin dependent (HCC) 12/06/2017   Migraine 12/06/2017    Past Surgical History:  Procedure Laterality Date   APPENDECTOMY  2005   COLONOSCOPY     MECHANICAL AORTIC VALVE REPLACEMENT  2016    OB History   No obstetric history on file.      Home Medications    Prior to Admission medications   Medication Sig Start Date End Date Taking? Authorizing Provider  predniSONE (STERAPRED UNI-PAK 21 TAB) 10 MG (21) TBPK tablet Take by mouth daily as directed 06/06/23  Yes Andrzej Scully, Ivette Marks, PA-C  atorvastatin (LIPITOR) 40 MG tablet TAKE 1 TABLET BY MOUTH EVERY DAY 09/14/22   Varanasi, Jayadeep S, MD  clindamycin (CLEOCIN) 300 MG capsule Take 300 mg by mouth 2 (two) times daily. Prior to dental procedure     [provider]  lisinopril (ZESTRIL) 10 MG tablet Take 10 mg by mouth daily. 03/28/21   [provider]  metoprolol succinate (TOPROL-XL) 25 MG 24 hr tablet Take 1 tablet (25 mg total) by mouth daily. 04/01/19   Tonna Frederic, MD  metoprolol tartrate (LOPRESSOR) 50 MG tablet Take 1 tablet 2 hours prior to CT Scan. 03/05/20   Lucendia Rusk, MD  MOUNJARO 10 MG/0.5ML Pen SMARTSIG:10 Milligram(s) SUB-Q Once a Week    [provider]  warfarin (COUMADIN) 5 MG tablet TAKE AS DIRECTED. 60 DAY SUPPLY 06/02/19   Tonna Frederic, MD    Family History Family History  Problem Relation Age of Onset   COPD Mother    Heart disease Mother    Squamous cell carcinoma Mother 47   COPD Father    Stroke Paternal Grandfather    Colon cancer Neg Hx    Esophageal cancer Neg Hx    Rectal cancer Neg Hx    Stomach cancer Neg Hx     Social History Social History   Tobacco Use   Smoking status: Never   Smokeless tobacco: Never  Vaping Use   Vaping status: Never Used  Substance Use Topics   Alcohol use: Yes    Comment: occasional   Drug use: Never     Allergies  Metformin and related, Hydrochlorothiazide, and Penicillins   Review of Systems Review of Systems  Skin:  Positive for rash.   Per HPI  Physical Exam Triage Vital Signs ED Triage Vitals [06/05/23 1155]  Encounter Vitals Group     BP (!) 160/77     Systolic BP Percentile      Diastolic BP Percentile      Pulse Rate 90     Resp 20     Temp 98.5 F (36.9 C)     Temp Source Oral     SpO2 98 %     Weight      Height      Head Circumference      Peak Flow      Pain Score 0     Pain Loc      Pain Education      Exclude from Growth Chart    No data found.  Updated Vital Signs BP (!) 160/77 (BP Location: Right Arm)   Pulse 90   Temp 98.5 F (36.9 C) (Oral)   Resp 20   SpO2 98%   Visual Acuity Right Eye Distance:   Left Eye Distance:   Bilateral Distance:    Right Eye  Near:   Left Eye Near:    Bilateral Near:     Physical Exam Vitals and nursing note reviewed.  Constitutional:      General: She is not in acute distress.    Appearance: Normal appearance.  HENT:     Mouth/Throat:     Mouth: Mucous membranes are moist.     Pharynx: Oropharynx is clear. No posterior oropharyngeal erythema.  Eyes:     Conjunctiva/sclera: Conjunctivae normal.     Pupils: Pupils are equal, round, and reactive to light.  Cardiovascular:     Rate and Rhythm: Normal rate and regular rhythm.     Pulses: Normal pulses.     Heart sounds: Normal heart sounds.  Pulmonary:     Effort: Pulmonary effort is normal. No respiratory distress.     Breath sounds: Normal breath sounds. No wheezing.  Abdominal:     General: Bowel sounds are normal.     Tenderness: There is no abdominal tenderness.  Musculoskeletal:        General: Normal range of motion.     Cervical back: Normal range of motion.  Skin:    Findings: Rash present.     Comments: Erythematous rash on bilateral eyelids, cheeks, chin and both anterior and posterior neck.  There is 1 area on the left upper arm.  Areas are excoriated  Neurological:     Mental Status: She is alert and oriented to person, place, and time.      UC Treatments / Results  Labs (all labs ordered are listed, but only abnormal results are displayed) Labs Reviewed  POCT FASTING CBG [facility] MANUAL ENTRY - Abnormal; Notable for the following components:      Result Value   POCT Glucose ([facility]) 106 (*)    All other components within normal limits    EKG   Radiology No results found.  Procedures Procedures (including critical care time)  Medications Ordered in UC Medications  methylPREDNISolone acetate (DEPO-MEDROL) injection 40 mg (40 mg Intramuscular Given 06/05/23 1232)    Initial Impression / Assessment and Plan / UC Course  I have reviewed the triage vital signs and the nursing notes.  Pertinent labs & imaging results that were  available during my care of the patient were  reviewed by me and considered in my medical decision making (see chart for details).  Rash consistent with a contact dermatitis, consider from plant exposure.  CBG in clinic 106 IM Depo-Medrol given for acute itch relief.  With rash on eyelids, face, neck, will avoid topical treatments.  Prednisone taper discussed, importance of monitoring blood glucose levels.  Understands side effects of medications.  Can return if needed.  Patient is agreeable to plan, no questions  Final Clinical Impressions(s) / UC Diagnoses   Final diagnoses:  Contact dermatitis due to plants, except food, unspecified contact dermatitis type  Pruritus     Discharge Instructions      Steroid injection today will start to work in about 30 minutes to reduce itch.  Starting tomorrow morning, take the prednisone taper. 6 pills on day 1 5 pills on day 2 4 pills on day 3, etc. Continue until all pills are gone! Please monitor blood sugar while taking the prednisone as your sugars WILL be higher!    ED Prescriptions     Medication Sig Dispense Auth. Provider   predniSONE (STERAPRED UNI-PAK 21 TAB) 10 MG (21) TBPK tablet Take by mouth daily as directed 21 tablet Adeli Frost, Ivette Marks, PA-C      PDMP not reviewed this encounter.   Diamon Reddinger, Beth Brooke 06/05/23 1254

## 2023-06-05 NOTE — ED Triage Notes (Signed)
 Pt c/o itchy red rash to neck and face since last night. Used a cream with no relief.

## 2023-06-06 ENCOUNTER — Ambulatory Visit (INDEPENDENT_AMBULATORY_CARE_PROVIDER_SITE_OTHER)

## 2023-06-06 DIAGNOSIS — Z5181 Encounter for therapeutic drug level monitoring: Secondary | ICD-10-CM | POA: Diagnosis not present

## 2023-06-06 LAB — POCT INR: INR: 3.4 — AB (ref 2.0–3.0)

## 2023-06-06 NOTE — Patient Instructions (Signed)
 Description   Called and spoke with pt and instructed to only take 1/2 tablet today, 1/2 tablet tomorrow, 1 tablet on Friday, and 1 tablet on Saturday and then resume taking warfarin 1.5 tablets of warfarin daily.  Stay consistent with greens (daily).  Recheck INR on Monday. ([place] pt pending appt with [facility] on 07/06/23) [facility] [phone]

## 2023-06-11 ENCOUNTER — Ambulatory Visit (INDEPENDENT_AMBULATORY_CARE_PROVIDER_SITE_OTHER): Admitting: Cardiovascular Disease

## 2023-06-11 DIAGNOSIS — Z7901 Long term (current) use of anticoagulants: Secondary | ICD-10-CM

## 2023-06-11 DIAGNOSIS — Z952 Presence of prosthetic heart valve: Secondary | ICD-10-CM

## 2023-06-11 LAB — POCT INR: INR: 1.6 — AB (ref 2.0–3.0)

## 2023-06-19 ENCOUNTER — Ambulatory Visit (INDEPENDENT_AMBULATORY_CARE_PROVIDER_SITE_OTHER): Payer: Self-pay

## 2023-06-19 DIAGNOSIS — Z5181 Encounter for therapeutic drug level monitoring: Secondary | ICD-10-CM | POA: Diagnosis not present

## 2023-06-19 LAB — POCT INR: INR: 3.6 — AB (ref 2.0–3.0)

## 2023-06-19 NOTE — Patient Instructions (Signed)
 Description   Called and spoke with pt and instructed to eat greens today and continue taking warfarin 1.5 tablets of warfarin daily.  Stay consistent with greens (daily).  Recheck INR in 2 weeks. ([place] pt pending appt with [facility] on 07/06/23) [facility] [phone]

## 2023-07-04 ENCOUNTER — Ambulatory Visit (INDEPENDENT_AMBULATORY_CARE_PROVIDER_SITE_OTHER): Admitting: *Deleted

## 2023-07-04 DIAGNOSIS — Z7901 Long term (current) use of anticoagulants: Secondary | ICD-10-CM

## 2023-07-04 DIAGNOSIS — Z952 Presence of prosthetic heart valve: Secondary | ICD-10-CM | POA: Diagnosis not present

## 2023-07-04 LAB — POCT INR: INR: 2.4 (ref 2.0–3.0)

## 2023-07-04 NOTE — Patient Instructions (Signed)
 Description   Called and spoke with pt and instructed to take 2 tablets of warfarin today then continue taking warfarin 1.5 tablets of warfarin daily.  Stay consistent with greens (daily).  Recheck INR in 2 weeks. ([place] pt pending appt with [facility] on 07/06/23) [facility] [phone]

## 2023-07-06 ENCOUNTER — Ambulatory Visit: Payer: Medicare Other | Attending: Cardiology | Admitting: Cardiology

## 2023-07-06 ENCOUNTER — Encounter: Payer: Self-pay | Admitting: Cardiology

## 2023-07-06 VITALS — BP 142/82 | HR 84 | Ht 63.0 in | Wt 236.6 lb

## 2023-07-06 DIAGNOSIS — I77810 Thoracic aortic ectasia: Secondary | ICD-10-CM

## 2023-07-06 DIAGNOSIS — Z952 Presence of prosthetic heart valve: Secondary | ICD-10-CM | POA: Diagnosis not present

## 2023-07-06 NOTE — Progress Notes (Signed)
 Cardiology Office Note:  .   Date:  07/06/2023  ID:  [facility], DOB 22-Feb-1955, MRN [id number] PCP: Azalia Leo, MD  [facility] Providers Cardiologist:  Dorothye Gathers, MD    History of Present Illness: .    History of Present Illness Denise Curtis is a 68 year old female with severe prosthetic valve aortic stenosis who presents for follow-up after aortic valve replacement.  She has a history of aortic valve replacement with a Saint Jude aortic valve, model number 21, serial number [id number], placed on March 05, 2014, at [facility] for a bicuspid aortic valve. Her cardiac catheterization was reported as normal. If a redo aortic valve replacement is needed, it would likely require aortic root replacement with a porcine root due to the small size of the current valve.  Her last CT scan on August 30, 2022, showed a stable ascending thoracic aortic aneurysm of 4.6 cm. An echocardiogram on March 05, 2023, showed a normal ejection fraction and a Saint Jude aortic valve mean gradient of 23 mmHg, Vmax 3.2 m/s, consistent with moderate patient prosthesis mismatch.  She has a history of diabetes, hypertension, and hyperlipidemia. Her current medications include atorvastatin  40 mg for cholesterol, lisinopril  10 mg for blood pressure, metoprolol  25 mg once a day, and warfarin. She uses a home machine for INR monitoring and finds the team managing her warfarin to be 'wonderful'.  She moved to [place] from [place] in 2019. 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 She first noticed symptoms when she felt unusually tired walking to her office, which led to the discovery of a heart murmur and subsequent diagnosis of a bicuspid aortic valve.     Studies Reviewed: Aaron Aas    EKG Interpretation Date/Time:  Friday Jul 06 2023 09:17:01 EDT Ventricular Rate:  84 PR  Interval:  190 QRS Duration:  90 QT Interval:  394 QTC Calculation: 465 R Axis:   41  Text Interpretation: Normal sinus rhythm Poor R wave progression No previous ECGs available Confirmed by Dorothye Gathers ([id number]) on 07/06/2023 9:51:25 AM    Results RADIOLOGY CT scan: Stable ascending thoracic aortic aneurysm of 4.6 cm (08/30/2022)  DIAGNOSTIC Cardiac catheterization: Normal Echocardiogram: Ejection fraction normal, St. Jude aortic valve mean gradient 23 mmHg, Vmax 3.2 m/s, moderate patient-prosthesis mismatch (03/05/2023)  Risk Assessment/Calculations:           Physical Exam:   VS:  BP (!) 142/82   Pulse 84   Ht 5\' 3"  (1.6 m)   Wt 236 lb 9.6 oz (107.3 kg)   SpO2 98%   BMI 41.91 kg/m    Wt Readings from Last 3 Encounters:  07/06/23 236 lb 9.6 oz (107.3 kg)  08/30/22 245 lb (111.1 kg)  07/04/22 250 lb (113.4 kg)    GEN: Well nourished, well developed in no acute distress NECK: No JVD; No carotid bruits CARDIAC: RRR, Sharp S2 click, 2/6 SM, no rubs, no gallops RESPIRATORY:  Clear to auscultation without rales, wheezing or rhonchi  ABDOMEN: Soft, non-tender, non-distended EXTREMITIES:  No edema; No deformity   ASSESSMENT AND PLAN: .    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 Echocardiogram on March 05, 2023, showed a  normal ejection fraction and a mean gradient of 23 mmHg, Vmax 3.2 m/s, consistent with moderate patient-prosthesis mismatch. Dr Sherene Dilling said: The condition is currently asymptomatic with normal left ventricular function. High-risk surgery may be required in the future if symptoms develop or left ventricular function worsens. A redo aortic valve replacement may necessitate aortic root replacement with a porcine root and replacement of the aorta up to the innominate artery, which  would be a major high-risk surgery. - Schedule annual echocardiograms and CT scans in January. - Discuss potential future surgical options if symptoms develop or left ventricular function worsens.  Ascending thoracic aortic aneurysm Ascending thoracic aortic aneurysm measuring 4.6 cm as of the last CT scan on August 30, 2022. The aneurysm is being monitored closely due to its size and potential risk for complications. - Schedule annual CT scans in January to monitor the aneurysm size.  Hypertension Hypertension is managed with lisinopril  10 mg daily. Blood pressure control is crucial for overall cardiovascular health. - Continue lisinopril  10 mg daily.  Hyperlipidemia Hyperlipidemia is managed with atorvastatin  40 mg daily to control cholesterol levels and reduce cardiovascular risk. - Continue atorvastatin  40 mg daily.  Diabetes mellitus Diabetes mellitus is managed as part of her overall health plan.         Dispo: Jan 2026  Signed, Dorothye Gathers, MD

## 2023-07-06 NOTE — Patient Instructions (Signed)
 Medication Instructions:  The current medical regimen is effective;  continue present plan and medications.  *If you need a refill on your cardiac medications before your next appointment, please call your pharmacy*  Testing/Procedures: Your physician has requested that you have an echocardiogram in January 2026. Echocardiography is a painless test that uses sound waves to create images of your heart. It provides your doctor with information about the size and shape of your heart and how well your heart's chambers and valves are working. This procedure takes approximately one hour. There are no restrictions for this procedure. Please do NOT wear cologne, perfume, aftershave, or lotions (deodorant is allowed). Please arrive 15 minutes prior to your appointment time.  Please note: We ask at that you not bring children with you during ultrasound (echo/ vascular) testing. Due to room size and safety concerns, children are not allowed in the ultrasound rooms during exams. Our front office staff cannot provide observation of children in our lobby area while testing is being conducted. An adult accompanying a patient to their appointment will only be allowed in the ultrasound room at the discretion of the ultrasound technician under special circumstances. We apologize for any inconvenience.  CTA-scan of the chest - this will be due in January 2026 and you will be contacted to be scheduled at a later date.   Follow-Up: At [facility], you and your health needs are our priority.  As part of our continuing mission to provide you with exceptional heart care, our providers are all part of one team.  This team includes your primary Cardiologist (physician) and Advanced Practice Providers or APPs (Physician Assistants and Nurse Practitioners) who all work together to provide you with the care you need, when you need it.  Your next appointment:   7 month(s)  Provider:   Dorothye Gathers, MD    We  recommend signing up for the patient portal called "MyChart".  Sign up information is provided on this After Visit Summary.  MyChart is used to connect with patients for Virtual Visits (Telemedicine).  Patients are able to view lab/test results, encounter notes, upcoming appointments, etc.  Non-urgent messages can be sent to your provider as well.   To learn more about what you can do with MyChart, go to [email or web address].

## 2023-07-11 NOTE — Progress Notes (Addendum)
 07/11/2023 Denise Curtis [id number] 11-19-1955   CHIEF COMPLAINT: Discuss scheduling a colonoscopy   HISTORY OF PRESENT ILLNESS: Denise Curtis is a 68 year old female with a past medical history of arthritis, hypertension, hyperlipidemia, bicuspid aortic valve s/p AVR 2016 with moderate prosthetic valve aortic stenosis, 4.6 cm ascending thoracic aortic aneurysm followed by cardiothoracic surgery, DM type II, obesity, migraine headaches, thyroid  nodules and colon polyps. She is known by Dr. Elvin Hammer. She presents today to discuss scheduling a colonoscopy.  Her most recent colonoscopy was 11/30/2019 which identified 6 polyps removed from the colon, diverticulosis in the left colon and internal hemorrhoids.  She was advised to repeat a colonoscopy in 3 years.  No known family history of colon cancer.  She denies having any abdominal pain.  She is passing a normal formed brown bowel movement daily.  No bloody or black stools.  No GERD symptoms or dysphagia.  She has a history of a bicuspid aortic valve status post AVR 2016 with severe prosthetic valve aortic stenosis followed by cardiologist Dr. Renna Cary and Charles Connor NP. Echocardiogram 03/05/2023 showed a normal ejection fraction and a mean gradient of 23 mmHg, Vmax 3.2 m/s, consistent with moderate patient-prosthesis mismatch. Per cardiology, high-risk surgery may be required in the future if symptoms develop or left ventricular function worsens. A redo aortic valve replacement may necessitate aortic root replacement with a porcine root and replacement of the aorta up to the innominate artery, which would be a major high-risk surgery.  She remains on warfarin and metoprolol .  No chest pain or shortness of breath.  She is on Mounjaro.  Labs were done by her PCP last week, results not available in Care Everywhere at this time.     ECHO 03/05/2023: Left ventricular ejection fraction, by estimation, is 60 to 65%. Left ventricular ejection fraction by 3D  volume is 58 %. The left ventricle has normal function. The left ventricle has no regional wall motion abnormalities. Left ventricular diastolic parameters are consistent with Grade II diastolic dysfunction (pseudonormalization). Elevated left atrial pressure. 1. Right ventricular systolic function is normal. The right ventricular size is normal. There is normal pulmonary artery systolic pressure. The estimated right ventricular systolic pressure is 28.8 mmHg. 2. 3. Left atrial size was mildly dilated. The mitral valve is normal in structure. Mild mitral valve regurgitation. No evidence of mitral stenosis. Moderate mitral annular calcification. 4. The aortic valve has been repaired/replaced. Aortic valve regurgitation is trivial. There is a 21 mm [employer] Jude mechanical valve present in the aortic position. Procedure Date: 2016. Aortic valve mean gradient measures 23.0 mmHg. Aortic valve Vmax measures 3.28 m/s. Aortic valve acceleration time measures 74 msec. 5. 6. There is moderate dilatation of the ascending aorta, measuring 44 mm. The inferior vena cava is normal in size with greater than 50% respiratory variability, suggesting right atrial pressure of 3 mmHg.   PAST GI PROCEDURES:  Colonoscopy 11/30/2019 by Dr. Elvin Hammer: - One 1 mm polyp in the ascending colon. Biopsied.  - Five 3 to 6 mm polyps in the descending colon, in the transverse colon and in the cecum, removed with a cold snare. Resected and retrieved.  - Diverticulosis in the left colon.  - Internal hemorrhoids.  - The examination was otherwise normal on direct and retroflexion view - 3 year recall colonoscopy  1. Surgical [P], colon, cecum, polyp (1) - MULTIPLE FRAGMENTS OF SESSILE SERRATED POLYP(S) - NO HIGH GRADE DYSPLASIA OR MALIGNANCY IDENTIFIED 2. Surgical [P], colon,  ascending, polyp (1) - BENIGN COLONIC MUCOSA WITH FOCAL HYPERPLASTIC CHANGES (1 OF 1 FRAGMENTS) - NO HIGH GRADE DYSPLASIA OR MALIGNANCY IDENTIFIED 3. Surgical [P], colon,  transverse, polyps (2) - TUBULAR ADENOMA (1 OF 3 FRAGMENTS) - SESSILE SERRATED POLYP (2 OF 3 FRAGMENTS) - NO HIGH GRADE DYSPLASIA OR MALIGNANCY IDENTIFIED 4. Surgical [P], colon, descending, polyps (2) - SESSILE SERRATED POLYP (2 OF 2 FRAGMENTS) - NO HIGH GRADE DYSPLASIA OR MALIGNANCY IDENTIFIED  Past Medical History:  Diagnosis Date   Arthritis    knee   Diabetes mellitus type II, non insulin dependent (HCC)    Heart murmur    Hyperlipidemia    Hypertension    Migraines    Past Surgical History:  Procedure Laterality Date   APPENDECTOMY  2005   COLONOSCOPY     MECHANICAL AORTIC VALVE REPLACEMENT  2016   Social History: She is married.  She has 3 sons.  Retired.  She reports that she has never smoked. She has never used smokeless tobacco. She reports current alcohol  use. She reports that she does not use drugs.  Family History: family history includes COPD in her father and mother; Heart disease in her mother; Squamous cell carcinoma (age of onset: 30) in her mother; Stroke in her paternal grandfather.  No known family history of colon cancer.   Allergies  Allergen Reactions   Metformin And Related Nausea And Vomiting   Hydrochlorothiazide Anxiety   Penicillins Rash      Outpatient Encounter Medications as of 07/12/2023  Medication Sig   atorvastatin  (LIPITOR) 40 MG tablet TAKE 1 TABLET BY MOUTH EVERY DAY   clindamycin (CLEOCIN) 300 MG capsule Take 300 mg by mouth 2 (two) times daily. Prior to dental procedure   lisinopril  (ZESTRIL ) 10 MG tablet Take 10 mg by mouth daily.   metoprolol  succinate (TOPROL -XL) 25 MG 24 hr tablet Take 1 tablet (25 mg total) by mouth daily.   metoprolol  tartrate (LOPRESSOR ) 50 MG tablet Take 1 tablet 2 hours prior to CT Scan.   MOUNJARO 10 MG/0.5ML Pen SMARTSIG:10 Milligram(s) SUB-Q Once a Week   predniSONE  (STERAPRED UNI-PAK 21 TAB) 10 MG (21) TBPK tablet Take by mouth daily as directed   warfarin (COUMADIN ) 5 MG tablet TAKE AS DIRECTED. 60  DAY SUPPLY   No facility-administered encounter medications on file as of 07/12/2023.   REVIEW OF SYSTEMS:  Gen: Denies fever, sweats or chills. + Intentional weight loss.  CV: Denies chest pain, palpitations or edema. Resp: Denies cough, shortness of breath of hemoptysis.  GI: Denies heartburn, dysphagia, stomach or lower abdominal pain. No diarrhea or constipation.  GU: Denies urinary burning, blood in urine, increased urinary frequency or incontinence. MS: Denies joint pain, muscles aches or weakness. Derm: Denies rash, itchiness, skin lesions or unhealing ulcers. Psych: Denies depression, anxiety, memory loss or confusion. Heme: Denies bruising, easy bleeding. Neuro:  Denies headaches, dizziness or paresthesias. Endo:  Denies any problems with DM, thyroid  or adrenal function.  PHYSICAL EXAM: BP (!) 140/70   Pulse 63   Ht 5\' 3"  (1.6 m)   Wt 236 lb (107 kg)   BMI 41.81 kg/m  Wt Readings from Last 3 Encounters:  07/12/23 236 lb (107 kg)  07/06/23 236 lb 9.6 oz (107.3 kg)  08/30/22 245 lb (111.1 kg)    General: 68 year old female in no acute distress. Head: Normocephalic and atraumatic. Eyes:  Sclerae non-icteric, conjunctive pink. Ears: Normal auditory acuity. Mouth: Dentition intact. No ulcers or lesions.  Neck: Supple, no lymphadenopathy  or thyromegaly.  Lungs: Clear bilaterally to auscultation without wheezes, crackles or rhonchi. Heart: Regular rate and rhythm.  Systolic murmur. No rub or gallop appreciated.  Abdomen: Soft, nontender, nondistended. No masses. No hepatosplenomegaly. Normoactive bowel sounds x 4 quadrants.  Rectal: Deferred.  Musculoskeletal: Symmetrical with no gross deformities. Skin: Warm and dry. No rash or lesions on visible extremities. Extremities: No edema. Neurological: Alert oriented x 4, no focal deficits.  Psychological: Alert and cooperative. Normal mood and affect.  ASSESSMENT AND PLAN:  68 year old female with a history of colon  polyps. -Colonoscopy a [facility] (secondary to moderate prosthetic valve aortic stenosis) benefits and risks discussed including risk with sedation, risk of bleeding, perforation and infection. ADDENDUM: See Dr. Alberta Almond addendum, case discussed with Denise Class CRNA, patient cleared to have colonoscopy at [facility]. - Cardiac clearance required prior to scheduling a colonoscopy, cardiac clearance to also include warfarin hold instructions prior to proceeding with a colonoscopy - Patient will need to hold Mounjaro for 7 days prior to colonoscopy date  History of a bicuspid aortic valve s/p AVR 2016 with moderate prosthetic valve aortic stenosis. ECHO 02/2023 showed a normal ejection fraction and a mean gradient of 23 mmHg, Vmax 3.2 m/s, consistent with moderate patient-prosthesis mismatch. - Cardiac clearance prior to pursuing a colon polyp surveillance colonoscopy as noted above  4.6 cm ascending thoracic aortic aneurysm per CTA 08/2022 -Continue follow up with cardiothoracic surgeon Dr. Sherene Dilling   Diabetes mellitus type 2 on Mounjaro  ADDENDUM: CARDIAC CLEARANCE RECEIVED 07/26/2023 AS FOLLOWS:  Chart reviewed as part of pre-operative protocol coverage. Given past medical history and time since last visit, based on ACC/AHA guidelines, Denise Curtis is at acceptable risk for the planned procedure without further cardiovascular testing.    Per office protocol, patient can hold wafarin for 5 days prior to procedure. Our protocol and guidelines would say no bridge is needed. Will defer to Dr. Renna Cary.    Per Dr Renna Cary: "OK to bridge per patient preference"   Message has been sent to coumadin  clinic: "Has bridged with enoxaparin  in the past when holding warfarin. Not required based on protocol, so patient choice. Please arrange with patient."   I will route this recommendation to the requesting party via Epic fax function and remove from pre-op pool.   Please call with questions.   Ava Boatman,  NP 07/26/2023, 8:36 AM     CC:  Azalia Leo, MD

## 2023-07-12 ENCOUNTER — Encounter: Payer: Self-pay | Admitting: Nurse Practitioner

## 2023-07-12 ENCOUNTER — Telehealth: Payer: Self-pay

## 2023-07-12 ENCOUNTER — Ambulatory Visit: Admitting: Nurse Practitioner

## 2023-07-12 VITALS — BP 140/70 | HR 63 | Ht 63.0 in | Wt 236.0 lb

## 2023-07-12 DIAGNOSIS — I7121 Aneurysm of the ascending aorta, without rupture: Secondary | ICD-10-CM

## 2023-07-12 DIAGNOSIS — Z860100 Personal history of colon polyps, unspecified: Secondary | ICD-10-CM | POA: Diagnosis not present

## 2023-07-12 NOTE — Patient Instructions (Addendum)
 We are getting cardiac clearance from Dr Dorothye Gathers before scheduling your colonoscopy.   _______________________________________________________  If your blood pressure at your visit was 140/90 or greater, please contact your primary care physician to follow up on this.  _______________________________________________________  If you are age 70 or older, your body mass index should be between 23-30. Your Body mass index is 41.81 kg/m. If this is out of the aforementioned range listed, please consider follow up with your Primary Care Provider.  If you are age 55 or younger, your body mass index should be between 19-25. Your Body mass index is 41.81 kg/m. If this is out of the aformentioned range listed, please consider follow up with your Primary Care Provider.   ________________________________________________________  The  [facility] providers would like to encourage you to use MYCHART to communicate with providers for non-urgent requests or questions.  Due to long hold times on the telephone, sending your provider a message by [facility] may be a faster and more efficient way to get a response.  Please allow 48 business hours for a response.  Please remember that this is for non-urgent requests.  _______________________________________________________

## 2023-07-12 NOTE — Telephone Encounter (Signed)
 [facility] Pre-operative Risk Assessment     Request for surgical clearance:     Endoscopy Procedure  What type of surgery is being performed?     colonoscopy  When is this surgery scheduled?     TBD  What type of clearance is required ?   Pharmacy and cardiac  Are there any medications that need to be held prior to surgery and how long? Warfarin, 5 days and history of aortic valve replacement  Practice name and name of physician performing surgery?      [facility]  What is your office phone and fax number?      Phone- [phone]  Fax- [phone]  Anesthesia type (None, local, MAC, general) ?       MAC   Please route your response to [place], [facility]

## 2023-07-12 NOTE — Telephone Encounter (Signed)
 Good Morning Dr. Renna Cary  We have received a surgical clearance request for Ms. Hauter for upcoming scheduled endoscopy procedure. They were seen recently in clinic on 07/06/2023. Can you please comment on surgical clearance and guidance on holding warfarin for 5 days prior to procedure. Please forward you guidance and recommendations to [facility] PREOP   Thank you, Charles Connor, NP

## 2023-07-13 NOTE — Progress Notes (Signed)
 Reviewed. Marlin Simmonds, I reviewed this case with Mr. Anthony Bateman, anesthesia. This patient is cleared for her procedure in the [facility].  She does not need to be done at the hospital. Please make sure that this patient gets scheduled for colonoscopy in the [facility] (not the hospital). Thank you, Dr. Elvin Hammer

## 2023-07-15 NOTE — Progress Notes (Signed)
 Colonoscopy will be schedule with Dr. Elvin Hammer in [facility] once cardiac clearance and Warfarin hold instructions received from cardiology.

## 2023-07-18 ENCOUNTER — Telehealth: Payer: Self-pay

## 2023-07-18 ENCOUNTER — Ambulatory Visit (INDEPENDENT_AMBULATORY_CARE_PROVIDER_SITE_OTHER): Admitting: Cardiology

## 2023-07-18 DIAGNOSIS — Z7901 Long term (current) use of anticoagulants: Secondary | ICD-10-CM | POA: Diagnosis not present

## 2023-07-18 DIAGNOSIS — Z952 Presence of prosthetic heart valve: Secondary | ICD-10-CM | POA: Diagnosis not present

## 2023-07-18 LAB — POCT INR: INR: 2.6 (ref 2.0–3.0)

## 2023-07-18 NOTE — Telephone Encounter (Signed)
 Lpm to discuss INR result.

## 2023-07-25 NOTE — Telephone Encounter (Signed)
 Patient with diagnosis of mechanical aortic valve replacement on warfarin for anticoagulation.    Procedure: colonoscopy Date of procedure: TBD  Patient with mechanical aortic valve. Her INR goal is 2.5-3.5, this was set by Dr Leonette Ramal at [facility] in [place]. Typically the goal is 2-3. I do not see any hx of stroke, afib or clot. Typically no bridge would be needed. She was, however, bridged in the past per patient choice in 2021.   CrCl 102 ml/min Platelet count 173  Per office protocol, patient can hold wafarin for 5 days prior to procedure.    Our protocol and guidelines would say no bridge is needed. Will defer to Dr. Renna Cary.  **This guidance is not considered finalized until pre-operative APP has relayed final recommendations.**

## 2023-07-26 ENCOUNTER — Telehealth (HOSPITAL_BASED_OUTPATIENT_CLINIC_OR_DEPARTMENT_OTHER): Payer: Self-pay | Admitting: Pharmacist Clinician (PhC)/ Clinical Pharmacy Specialist

## 2023-07-26 NOTE — Telephone Encounter (Signed)
 Message sent to Coumadin  clinic to arrange bridging.

## 2023-07-26 NOTE — Telephone Encounter (Signed)
   Patient Name: Denise Curtis  DOB: 1955-03-27 MRN: [id number]  Primary Cardiologist: Dorothye Gathers, MD  Chart reviewed as part of pre-operative protocol coverage. Given past medical history and time since last visit, based on ACC/AHA guidelines, Denise Curtis is at acceptable risk for the planned procedure without further cardiovascular testing.   Per office protocol, patient can hold wafarin for 5 days prior to procedure. Our protocol and guidelines would say no bridge is needed. Will defer to Dr. Renna Cary.   Per Dr Renna Cary: "OK to bridge per patient preference"  Message has been sent to coumadin  clinic: "Has bridged with enoxaparin  in the past when holding warfarin. Not required based on protocol, so patient choice. Please arrange with patient."  I will route this recommendation to the requesting party via Epic fax function and remove from pre-op pool.  Please call with questions.  Ava Boatman, NP 07/26/2023, 8:36 AM

## 2023-07-26 NOTE — Telephone Encounter (Signed)
 Pt with upcoming colonoscopy, date TBD.  Has bridged with enoxaparin  in the past when holding warfarin.   Not required based on protocol, so patient choice.   Please arrange with patient.

## 2023-07-30 MED ORDER — NA SULFATE-K SULFATE-MG SULF 17.5-3.13-1.6 GM/177ML PO SOLN: 1.0000 | Freq: Once | ORAL | 0 refills | Status: AC

## 2023-07-30 NOTE — Telephone Encounter (Signed)
 Denise Curtis, ok to schedule patient for a colonoscopy in [facility] with Dr. Elvin Hammer. Please inform patient to hold Warfarin for 5 days prior to her colonoscopy date. Refer to cardiac clearance notes regarding Lovenox  bridging. Cardiology noted Lovenox  bridging not required per their protocol at this juncture but ok to prescribe Lovenox  bridge if patient prefers. If patient prefer to have Lovenox  bridge, she will need to contact cardiology for Lovenox  bridge instructions. Pls let me know what patient decides. THX.  Dr. Elvin Hammer, FYI

## 2023-07-30 NOTE — Telephone Encounter (Signed)
 Pt returned call and colonoscopy is scheduled with Dr Elvin Hammer on 09/03/23 at 3 pm. Instructions sent via portal, suprep sent to pharmacy and referral placed. Pt states she would feel more comfortable with a Lovenox  bridge, she is going to call her cardiologist to set that up. She has been informed to stop Warfarin 5 days prior per cards clearance.   Electronically signed:  L[place]ell River, NCMA

## 2023-07-30 NOTE — Addendum Note (Signed)
 Addended by: Jheremy Boger B on: 07/30/2023 04:47 PM   Modules accepted: Orders

## 2023-07-30 NOTE — Telephone Encounter (Signed)
 LMTCB to schedule colonoscopy with Dr Elvin Hammer.

## 2023-08-01 ENCOUNTER — Ambulatory Visit (INDEPENDENT_AMBULATORY_CARE_PROVIDER_SITE_OTHER): Admitting: Cardiology

## 2023-08-01 DIAGNOSIS — Z952 Presence of prosthetic heart valve: Secondary | ICD-10-CM

## 2023-08-01 DIAGNOSIS — Z7901 Long term (current) use of anticoagulants: Secondary | ICD-10-CM

## 2023-08-01 LAB — POCT INR: INR: 3 (ref 2.0–3.0)

## 2023-08-01 NOTE — Telephone Encounter (Signed)
 Thanks [employer]. Please continue to see this through.  Thanks

## 2023-08-01 NOTE — Telephone Encounter (Signed)
 Stephanie, pls have patient contact our office once she has the specific Lovenox  bridge dose/instructions from her cardiologist. Sharleen Dawley.  Dr. Levie Ream

## 2023-08-03 ENCOUNTER — Other Ambulatory Visit: Payer: Self-pay | Admitting: Surgery

## 2023-08-03 DIAGNOSIS — I7121 Aneurysm of the ascending aorta, without rupture: Secondary | ICD-10-CM

## 2023-08-03 NOTE — Telephone Encounter (Signed)
 Portal msg sent to pt to contact us  in regards to Lovenox  bridge once she has instructions from cardiology.

## 2023-08-06 ENCOUNTER — Ambulatory Visit

## 2023-08-15 ENCOUNTER — Ambulatory Visit (INDEPENDENT_AMBULATORY_CARE_PROVIDER_SITE_OTHER)

## 2023-08-15 DIAGNOSIS — Z952 Presence of prosthetic heart valve: Secondary | ICD-10-CM | POA: Diagnosis not present

## 2023-08-15 DIAGNOSIS — Z5181 Encounter for therapeutic drug level monitoring: Secondary | ICD-10-CM

## 2023-08-15 LAB — POCT INR: INR: 2.9 (ref 2.0–3.0)

## 2023-08-15 MED ORDER — ENOXAPARIN SODIUM 100 MG/ML IJ SOSY: 100.0000 mg | PREFILLED_SYRINGE | Freq: Two times a day (BID) | INTRAMUSCULAR | 1 refills | Status: DC

## 2023-08-15 NOTE — Telephone Encounter (Signed)
 Denise Curtis received her Lovenox  bridge instructions today for her upcoming procedures. Please see below..     Electronically signed:  Corean Amsterdam, NCMA

## 2023-08-15 NOTE — Patient Instructions (Addendum)
 Continue taking warfarin 1.5 tablets of warfarin daily. ON 08/28/23 FOLLOW PRE/POST PROCEDURE INSTRUCTIONS.   Stay consistent with greens (daily).  Recheck INR on 09/10/23 post procedure  7/8: Last dose of Warfarin   7/9: No warfarin or enoxaparin  (Lovenox ).  7/10: Inject enoxaparin  100mg  in the fatty abdominal tissue at least 2 inches from the belly button twice a day about 12 hours apart, 10am and 10pm rotate sites. No warfarin.  7/11: Inject enoxaparin  in the fatty tissue every 12 hours, 10am and 10pm. No warfarin.  7/12: Inject enoxaparin  in the fatty tissue every 12 hours, 10am and 10pm. No warfarin.  7/13: Inject enoxaparin  in the fatty tissue in the morning at 10am (No PM dose). No warfarin.  7/14: Procedure Day - No enoxaparin  - Resume warfarin in the evening or as directed by doctor (take an extra half tablet with usual dose)   7/15: Resume enoxaparin  inject in the fatty tissue every 12 hours and take warfarin (take an extra half tablet with usual dose)   7/16: Inject enoxaparin  in the fatty tissue every 12 hours and take warfarin  7/17: Inject enoxaparin  in the fatty tissue every 12 hours and take warfarin  7/18: Inject enoxaparin  in the fatty tissue every 12 hours and take warfarin  7/19: Inject enoxaparin  in the fatty tissue every 12 hours and take warfarin  7/20: Inject enoxaparin  in the fatty tissue every 12 hours and take warfarin  7/21: Inject enoxaparin  in the fatty tissue every 12 hours and check INR.

## 2023-08-15 NOTE — Progress Notes (Signed)
Please see anticoagulation encounter.

## 2023-08-27 ENCOUNTER — Encounter: Payer: Self-pay | Admitting: Internal Medicine

## 2023-09-03 ENCOUNTER — Encounter: Payer: Self-pay | Admitting: Internal Medicine

## 2023-09-03 ENCOUNTER — Ambulatory Visit (AMBULATORY_SURGERY_CENTER): Admitting: Internal Medicine

## 2023-09-03 VITALS — BP 145/62 | HR 76 | Temp 97.2°F | Resp 17 | Ht 63.0 in | Wt 236.0 lb

## 2023-09-03 DIAGNOSIS — D123 Benign neoplasm of transverse colon: Secondary | ICD-10-CM

## 2023-09-03 DIAGNOSIS — K5730 Diverticulosis of large intestine without perforation or abscess without bleeding: Secondary | ICD-10-CM

## 2023-09-03 DIAGNOSIS — Z860100 Personal history of colon polyps, unspecified: Secondary | ICD-10-CM

## 2023-09-03 DIAGNOSIS — Z1211 Encounter for screening for malignant neoplasm of colon: Secondary | ICD-10-CM

## 2023-09-03 DIAGNOSIS — D122 Benign neoplasm of ascending colon: Secondary | ICD-10-CM

## 2023-09-03 MED ORDER — SODIUM CHLORIDE 0.9 % IV SOLN: 500.0000 mL | INTRAVENOUS | Status: DC

## 2023-09-03 NOTE — Progress Notes (Signed)
To PACU, VSS. Report to [email or web address] 

## 2023-09-03 NOTE — Progress Notes (Signed)
 Expand All Collapse All        07/11/2023 Denise Curtis [id number] 1955-12-12     CHIEF COMPLAINT: Discuss scheduling a colonoscopy    HISTORY OF PRESENT ILLNESS: Denise Curtis is a 67 year old female with a past medical history of arthritis, hypertension, hyperlipidemia, bicuspid aortic valve s/p AVR 2016 with moderate prosthetic valve aortic stenosis, 4.6 cm ascending thoracic aortic aneurysm followed by cardiothoracic surgery, DM type II, obesity, migraine headaches, thyroid  nodules and colon polyps. She is known by Dr. Abran. She presents today to discuss scheduling a colonoscopy.  Her most recent colonoscopy was 11/30/2019 which identified 6 polyps removed from the colon, diverticulosis in the left colon and internal hemorrhoids.  She was advised to repeat a colonoscopy in 3 years.  No known family history of colon cancer.  She denies having any abdominal pain.  She is passing a normal formed brown bowel movement daily.  No bloody or black stools.  No GERD symptoms or dysphagia.  She has a history of a bicuspid aortic valve status post AVR 2016 with severe prosthetic valve aortic stenosis followed by cardiologist Dr. Jeffrie and Jackee Alberts NP. Echocardiogram 03/05/2023 showed a normal ejection fraction and a mean gradient of 23 mmHg, Vmax 3.2 m/s, consistent with moderate patient-prosthesis mismatch. Per cardiology, high-risk surgery may be required in the future if symptoms develop or left ventricular function worsens. A redo aortic valve replacement may necessitate aortic root replacement with a porcine root and replacement of the aorta up to the innominate artery, which would be a major high-risk surgery.  She remains on warfarin and metoprolol .  No chest pain or shortness of breath.  She is on Mounjaro.  Labs were done by her PCP last week, results not available in Care Everywhere at this time.     ECHO 03/05/2023: Left ventricular ejection fraction, by estimation, is 60 to 65%. Left  ventricular ejection fraction by 3D volume is 58 %. The left ventricle has normal function. The left ventricle has no regional wall motion abnormalities. Left ventricular diastolic parameters are consistent with Grade II diastolic dysfunction (pseudonormalization). Elevated left atrial pressure. 1. Right ventricular systolic function is normal. The right ventricular size is normal. There is normal pulmonary artery systolic pressure. The estimated right ventricular systolic pressure is 28.8 mmHg. 2. 3. Left atrial size was mildly dilated. The mitral valve is normal in structure. Mild mitral valve regurgitation. No evidence of mitral stenosis. Moderate mitral annular calcification. 4. The aortic valve has been repaired/replaced. Aortic valve regurgitation is trivial. There is a 21 mm [employer] Jude mechanical valve present in the aortic position. Procedure Date: 2016. Aortic valve mean gradient measures 23.0 mmHg. Aortic valve Vmax measures 3.28 m/s. Aortic valve acceleration time measures 74 msec. 5. 6. There is moderate dilatation of the ascending aorta, measuring 44 mm. The inferior vena cava is normal in size with greater than 50% respiratory variability, suggesting right atrial pressure of 3 mmHg.    PAST GI PROCEDURES:   Colonoscopy 11/30/2019 by Dr. Abran: - One 1 mm polyp in the ascending colon. Biopsied.  - Five 3 to 6 mm polyps in the descending colon, in the transverse colon and in the cecum, removed with a cold snare. Resected and retrieved.  - Diverticulosis in the left colon.  - Internal hemorrhoids.  - The examination was otherwise normal on direct and retroflexion view - 3 year recall colonoscopy  1. Surgical [P], colon, cecum, polyp (1) - MULTIPLE FRAGMENTS OF SESSILE SERRATED  POLYP(S) - NO HIGH GRADE DYSPLASIA OR MALIGNANCY IDENTIFIED 2. Surgical [P], colon, ascending, polyp (1) - BENIGN COLONIC MUCOSA WITH FOCAL HYPERPLASTIC CHANGES (1 OF 1 FRAGMENTS) - NO HIGH GRADE DYSPLASIA OR  MALIGNANCY IDENTIFIED 3. Surgical [P], colon, transverse, polyps (2) - TUBULAR ADENOMA (1 OF 3 FRAGMENTS) - SESSILE SERRATED POLYP (2 OF 3 FRAGMENTS) - NO HIGH GRADE DYSPLASIA OR MALIGNANCY IDENTIFIED 4. Surgical [P], colon, descending, polyps (2) - SESSILE SERRATED POLYP (2 OF 2 FRAGMENTS) - NO HIGH GRADE DYSPLASIA OR MALIGNANCY IDENTIFIED       Past Medical History:  Diagnosis Date   Arthritis      knee   Diabetes mellitus type II, non insulin dependent (HCC)     Heart murmur     Hyperlipidemia     Hypertension     Migraines               Past Surgical History:  Procedure Laterality Date   APPENDECTOMY   2005   COLONOSCOPY       MECHANICAL AORTIC VALVE REPLACEMENT   2016        Social History: She is married.  She has 3 sons.  Retired.  She reports that she has never smoked. She has never used smokeless tobacco. She reports current alcohol  use. She reports that she does not use drugs.   Family History: family history includes COPD in her father and mother; Heart disease in her mother; Squamous cell carcinoma (age of onset: 52) in her mother; Stroke in her paternal grandfather.  No known family history of colon cancer.     Allergies      Allergies  Allergen Reactions   Metformin And Related Nausea And Vomiting   Hydrochlorothiazide Anxiety   Penicillins Rash              Outpatient Encounter Medications as of 07/12/2023  Medication Sig   atorvastatin  (LIPITOR) 40 MG tablet TAKE 1 TABLET BY MOUTH EVERY DAY   clindamycin (CLEOCIN) 300 MG capsule Take 300 mg by mouth 2 (two) times daily. Prior to dental procedure   lisinopril  (ZESTRIL ) 10 MG tablet Take 10 mg by mouth daily.   metoprolol  succinate (TOPROL -XL) 25 MG 24 hr tablet Take 1 tablet (25 mg total) by mouth daily.   metoprolol  tartrate (LOPRESSOR ) 50 MG tablet Take 1 tablet 2 hours prior to CT Scan.   MOUNJARO 10 MG/0.5ML Pen SMARTSIG:10 Milligram(s) SUB-Q Once a Week   predniSONE  (STERAPRED UNI-PAK 21 TAB)  10 MG (21) TBPK tablet Take by mouth daily as directed   warfarin (COUMADIN ) 5 MG tablet TAKE AS DIRECTED. 60 DAY SUPPLY      No facility-administered encounter medications on file as of 07/12/2023.      REVIEW OF SYSTEMS:  Gen: Denies fever, sweats or chills. + Intentional weight loss.  CV: Denies chest pain, palpitations or edema. Resp: Denies cough, shortness of breath of hemoptysis.  GI: Denies heartburn, dysphagia, stomach or lower abdominal pain. No diarrhea or constipation.  GU: Denies urinary burning, blood in urine, increased urinary frequency or incontinence. MS: Denies joint pain, muscles aches or weakness. Derm: Denies rash, itchiness, skin lesions or unhealing ulcers. Psych: Denies depression, anxiety, memory loss or confusion. Heme: Denies bruising, easy bleeding. Neuro:  Denies headaches, dizziness or paresthesias. Endo:  Denies any problems with DM, thyroid  or adrenal function.   PHYSICAL EXAM: BP (!) 140/70   Pulse 63   Ht 5' 3 (1.6 m)   Wt 236 lb (107 kg)  BMI 41.81 kg/m     Wt Readings from Last 3 Encounters:  07/12/23 236 lb (107 kg)  07/06/23 236 lb 9.6 oz (107.3 kg)  08/30/22 245 lb (111.1 kg)    General: 68 year old female in no acute distress. Head: Normocephalic and atraumatic. Eyes:  Sclerae non-icteric, conjunctive pink. Ears: Normal auditory acuity. Mouth: Dentition intact. No ulcers or lesions.  Neck: Supple, no lymphadenopathy or thyromegaly.  Lungs: Clear bilaterally to auscultation without wheezes, crackles or rhonchi. Heart: Regular rate and rhythm.  Systolic murmur. No rub or gallop appreciated.  Abdomen: Soft, nontender, nondistended. No masses. No hepatosplenomegaly. Normoactive bowel sounds x 4 quadrants.  Rectal: Deferred.  Musculoskeletal: Symmetrical with no gross deformities. Skin: Warm and dry. No rash or lesions on visible extremities. Extremities: No edema. Neurological: Alert oriented x 4, no focal deficits.  Psychological:  Alert and cooperative. Normal mood and affect.   ASSESSMENT AND PLAN:   68 year old female with a history of colon polyps. -Colonoscopy a [facility] (secondary to moderate prosthetic valve aortic stenosis) benefits and risks discussed including risk with sedation, risk of bleeding, perforation and infection. ADDENDUM: See Dr. Nancyann addendum, case discussed with Norleen Schillings CRNA, patient cleared to have colonoscopy at [facility]. - Cardiac clearance required prior to scheduling a colonoscopy, cardiac clearance to also include warfarin hold instructions prior to proceeding with a colonoscopy - Patient will need to hold Mounjaro for 7 days prior to colonoscopy date   History of a bicuspid aortic valve s/p AVR 2016 with moderate prosthetic valve aortic stenosis. ECHO 02/2023 showed a normal ejection fraction and a mean gradient of 23 mmHg, Vmax 3.2 m/s, consistent with moderate patient-prosthesis mismatch. - Cardiac clearance prior to pursuing a colon polyp surveillance colonoscopy as noted above   4.6 cm ascending thoracic aortic aneurysm per CTA 08/2022 -Continue follow up with cardiothoracic surgeon Dr. Lucas    Diabetes mellitus type 2 on Mounjaro   ADDENDUM: CARDIAC CLEARANCE RECEIVED 07/26/2023 AS FOLLOWS:  Chart reviewed as part of pre-operative protocol coverage. Given past medical history and time since last visit, based on ACC/AHA guidelines, Denise Curtis is at acceptable risk for the planned procedure without further cardiovascular testing.    Per office protocol, patient can hold wafarin for 5 days prior to procedure. Our protocol and guidelines would say no bridge is needed. Will defer to Dr. Jeffrie.    Per Dr Jeffrie: OK to bridge per patient preference   Message has been sent to coumadin  clinic: Has bridged with enoxaparin  in the past when holding warfarin. Not required based on protocol, so patient choice. Please arrange with patient.   I will route this recommendation to the requesting  party via Epic fax function and remove from pre-op pool.   Please call with questions.   Denise LITTIE Louis, NP 07/26/2023, 8:36 AM         Recent office evaluation including complete H&P as above.  No interval change.  Now for surveillance colonoscopy.

## 2023-09-03 NOTE — Patient Instructions (Signed)
 Please read handouts provided. Continue present medications. Await pathology results. Resume Coumadin  ( Warfarin ) today at prior dose and Lovenox  per your Coumadin  clinic. Resume previous diet.   YOU HAD AN ENDOSCOPIC PROCEDURE TODAY AT THE [facility]:   Refer to the procedure report that was given to you for any specific questions about what was found during the examination.  If the procedure report does not answer your questions, please call your gastroenterologist to clarify.  If you requested that your care partner not be given the details of your procedure findings, then the procedure report has been included in a sealed envelope for you to review at your convenience later.  YOU SHOULD EXPECT: Some feelings of bloating in the abdomen. Passage of more gas than usual.  Walking can help get rid of the air that was put into your GI tract during the procedure and reduce the bloating. If you had a lower endoscopy (such as a colonoscopy or flexible sigmoidoscopy) you may notice spotting of blood in your stool or on the toilet paper. If you underwent a bowel prep for your procedure, you may not have a normal bowel movement for a few days.  Please Note:  You might notice some irritation and congestion in your nose or some drainage.  This is from the oxygen used during your procedure.  There is no need for concern and it should clear up in a day or so.  SYMPTOMS TO REPORT IMMEDIATELY:  Following lower endoscopy (colonoscopy or flexible sigmoidoscopy):  Excessive amounts of blood in the stool  Significant tenderness or worsening of abdominal pains  Swelling of the abdomen that is new, acute  Fever of 100F or higher.  For urgent or emergent issues, a gastroenterologist can be reached at any hour by calling [phone]. Do not use MyChart messaging for urgent concerns.    DIET:  We do recommend a small meal at first, but then you may proceed to your regular diet.  Drink plenty of  fluids but you should avoid alcoholic beverages for 24 hours.  ACTIVITY:  You should plan to take it easy for the rest of today and you should NOT DRIVE or use heavy machinery until tomorrow (because of the sedation medicines used during the test).    FOLLOW UP: Our staff will call the number listed on your records the next business day following your procedure.  We will call around 7:15- 8:00 am to check on you and address any questions or concerns that you may have regarding the information given to you following your procedure. If we do not reach you, we will leave a message.     If any biopsies were taken you will be contacted by phone or by letter within the next 1-3 weeks.  Please call us  at [phone] if you have not heard about the biopsies in 3 weeks.    SIGNATURES/CONFIDENTIALITY: You and/or your care partner have signed paperwork which will be entered into your electronic medical record.  These signatures attest to the fact that that the information above on your After Visit Summary has been reviewed and is understood.  Full responsibility of the confidentiality of this discharge information lies with you and/or your care-partner.

## 2023-09-03 NOTE — Op Note (Signed)
 [facility] Patient Name: Denise Curtis Procedure Date: 09/03/2023 4:09 PM MRN: [id number] Endoscopist: Norleen SAILOR. Abran , MD, [id number] Age: 68 Referring MD:  Date of Birth: July 23, 1955 Gender: Female Account #: [id number] Procedure:                Colonoscopy with cold snare polypectomy x 1; biopsy                            polypectomy x 1 Indications:              High risk colon cancer surveillance: Personal                            history of non-advanced adenoma, High risk colon                            cancer surveillance: Personal history of multiple                            sessile serrated colon polyp (less than 10 mm in                            size) with no dysplasia. Examination elsewhere.                            Most recently here 2021. Medicines:                Monitored Anesthesia Care Procedure:                Pre-Anesthesia Assessment:                           - Prior to the procedure, a History and Physical                            was performed, and patient medications and                            allergies were reviewed. The patient's tolerance of                            previous anesthesia was also reviewed. The risks                            and benefits of the procedure and the sedation                            options and risks were discussed with the patient.                            All questions were answered, and informed consent                            was obtained. Prior Anticoagulants: The patient has  taken Coumadin  (warfarin), last dose was 7 days                            prior to procedure, and Lovenox , last dose                            yesterday. ASA Grade Assessment: III - A patient                            with severe systemic disease. After reviewing the                            risks and benefits, the patient was deemed in                            satisfactory condition to  undergo the procedure.                           After obtaining informed consent, the colonoscope                            was passed under direct vision. Throughout the                            procedure, the patient's blood pressure, pulse, and                            oxygen saturations were monitored continuously. The                            CF [id number] #[id number] was introduced through the anus                            and advanced to the the cecum, identified by                            appendiceal orifice and ileocecal valve. The                            ileocecal valve, appendiceal orifice, and rectum                            were photographed. The quality of the bowel                            preparation was excellent. The colonoscopy was                            performed without difficulty. The patient tolerated                            the procedure well. The bowel preparation used was  SUPREP via split dose instruction. Scope In: 4:25:11 PM Scope Out: 4:41:27 PM Scope Withdrawal Time: 0 hours 12 minutes 17 seconds  Total Procedure Duration: 0 hours 16 minutes 16 seconds  Findings:                 A 12 mm polyp was found in the ascending colon. The                            polyp was semi-pedunculated. The polyp was removed                            with a cold snare. Resection and retrieval were                            complete.                           A 2 mm polyp was found in the transverse colon. The                            polyp was removed with a jumbo cold forceps.                            Resection and retrieval were complete.                           Multiple diverticula were found in the left colon.                           The exam was otherwise without abnormality on                            direct and retroflexion views. Complications:            No immediate complications. Estimated blood loss:                             None. Estimated Blood Loss:     Estimated blood loss: none. Impression:               - One 12 mm polyp in the ascending colon, removed                            with a cold snare. Resected and retrieved.                           - One 2 mm polyp in the transverse colon, removed                            with a jumbo cold forceps. Resected and retrieved.                           - Diverticulosis in the left colon.                           -  The examination was otherwise normal on direct                            and retroflexion views. Recommendation:           - Repeat colonoscopy in 3 - 5 years for                            surveillance.                           - Resume Coumadin  (warfarin) today at prior dose                            and Lovenox  per your Coumadin  clinic directive.                           - Patient has a contact number available for                            emergencies. The signs and symptoms of potential                            delayed complications were discussed with the                            patient. Return to normal activities tomorrow.                            Written discharge instructions were provided to the                            patient.                           - Resume previous diet.                           - Continue present medications.                           - Await pathology results. Norleen SAILOR. Abran, MD 09/03/2023 4:50:41 PM This report has been signed electronically.

## 2023-09-04 ENCOUNTER — Ambulatory Visit (HOSPITAL_COMMUNITY)

## 2023-09-04 ENCOUNTER — Other Ambulatory Visit (HOSPITAL_COMMUNITY)

## 2023-09-04 ENCOUNTER — Telehealth: Payer: Self-pay | Admitting: *Deleted

## 2023-09-04 NOTE — Telephone Encounter (Signed)
  Follow up Call-     09/03/2023    3:20 PM  Call back number  Post procedure Call Back phone  # [phone]  Permission to leave phone message Yes     Patient questions:  Do you have a fever, pain , or abdominal swelling? No. Pain Score  0 *  Have you tolerated food without any problems? Yes.    Have you been able to return to your normal activities? Yes.    Do you have any questions about your discharge instructions: Diet   No. Medications  No. Follow up visit  No.  Do you have questions or concerns about your Care? No.  Actions: * If pain score is 4 or above: No action needed, pain <4.

## 2023-09-05 ENCOUNTER — Ambulatory Visit
Admission: RE | Admit: 2023-09-05 | Discharge: 2023-09-05 | Disposition: A | Discharge status: Home / Self Care | Source: Ambulatory Visit | Attending: Internal Medicine

## 2023-09-05 DIAGNOSIS — Z1231 Encounter for screening mammogram for malignant neoplasm of breast: Secondary | ICD-10-CM

## 2023-09-06 LAB — SURGICAL PATHOLOGY

## 2023-09-07 ENCOUNTER — Ambulatory Visit: Payer: Self-pay | Admitting: Internal Medicine

## 2023-09-09 ENCOUNTER — Telehealth: Payer: Self-pay | Admitting: Home Health

## 2023-09-09 LAB — POCT INR: INR: 1.7 — AB (ref 2.0–3.0)

## 2023-09-09 NOTE — Telephone Encounter (Signed)
 Patient called after-hours line today, states she had very minor amount rectal bleeding today, she had a colonoscopy last Monday, was resumed on Coumadin  Monday.  She was taking Lovenox  bridging therapy.  Office notes reviewed, pharmacy team had not recommended bridge therapy based on their protocol and guideline initially, but she was bridged due to her personal preference, this was okayed per Dr. Jeffrie.  She is asking if she should continue her Lovenox  today as this was her last dose. She states her INR is 1.7 today.   As the pharmacy has never recommended bridging therapy for colonoscopy and she was bridged due to her personal preference, now with minor rectal bleeding, advised her okay to stop Lovenox  and continue watch for worsening of bleeding, will continue her Coumadin  dose.  If she has more question, advised patient to call back to anticoagulation clinic tomorrow.  She verbalized understanding.

## 2023-09-10 ENCOUNTER — Ambulatory Visit (INDEPENDENT_AMBULATORY_CARE_PROVIDER_SITE_OTHER): Admitting: Internal Medicine

## 2023-09-10 DIAGNOSIS — Z952 Presence of prosthetic heart valve: Secondary | ICD-10-CM

## 2023-09-10 DIAGNOSIS — Z7901 Long term (current) use of anticoagulants: Secondary | ICD-10-CM

## 2023-09-10 NOTE — Progress Notes (Signed)
 INR 1.7  Please see anticoagulation encounter

## 2023-09-11 ENCOUNTER — Ambulatory Visit

## 2023-09-19 ENCOUNTER — Ambulatory Visit: Payer: Self-pay

## 2023-09-19 LAB — POCT INR: INR: 2.7 (ref 2.0–3.0)

## 2023-09-19 NOTE — Patient Instructions (Signed)
 Description   Called and spoke with pt and instructed to continue taking warfarin 1.5 tablets of warfarin daily.  Stay consistent with greens (daily).  Recheck INR in 2 weeks. ([place] pt pending appt with [facility] on 07/06/23) [facility] [phone]

## 2023-09-19 NOTE — Progress Notes (Signed)
 INR 2.7  Please see anticoagulation encounter

## 2023-10-03 ENCOUNTER — Ambulatory Visit (INDEPENDENT_AMBULATORY_CARE_PROVIDER_SITE_OTHER)

## 2023-10-03 DIAGNOSIS — Z5181 Encounter for therapeutic drug level monitoring: Secondary | ICD-10-CM

## 2023-10-03 LAB — POCT INR: INR: 2.5 (ref 2.0–3.0)

## 2023-10-03 NOTE — Patient Instructions (Signed)
 Description   Called and spoke with pt and instructed to pt to take 2 tablets today and then continue taking warfarin 1.5 tablets of warfarin daily.  Stay consistent with greens (daily).  Recheck INR in 2 weeks (Former [place] pt pending appt with [facility] on 07/06/23) Coumadin  Clinic [phone]

## 2023-10-03 NOTE — Progress Notes (Signed)
 INR 2.5. Please see anticoagulation encounter

## 2023-10-17 ENCOUNTER — Ambulatory Visit (INDEPENDENT_AMBULATORY_CARE_PROVIDER_SITE_OTHER)

## 2023-10-17 DIAGNOSIS — Z7901 Long term (current) use of anticoagulants: Secondary | ICD-10-CM

## 2023-10-17 DIAGNOSIS — Z952 Presence of prosthetic heart valve: Secondary | ICD-10-CM

## 2023-10-17 LAB — POCT INR: INR: 2.3 (ref 2.0–3.0)

## 2023-10-17 NOTE — Progress Notes (Signed)
 Description   INR 2.3 Called and spoke with pt and instructed to pt to take 2 tablets today and then continue taking warfarin 1.5 tablets of warfarin daily.  Stay consistent with greens (daily).  Recheck INR in 2 weeks Coumadin  Clinic [phone]

## 2023-10-17 NOTE — Patient Instructions (Signed)
 Description   INR 2.3 Called and spoke with pt and instructed to pt to take 2 tablets today and then continue taking warfarin 1.5 tablets of warfarin daily.  Stay consistent with greens (daily).  Recheck INR in 2 weeks Coumadin  Clinic [phone]

## 2023-10-31 ENCOUNTER — Ambulatory Visit (INDEPENDENT_AMBULATORY_CARE_PROVIDER_SITE_OTHER): Admitting: Internal Medicine

## 2023-10-31 DIAGNOSIS — Z7901 Long term (current) use of anticoagulants: Secondary | ICD-10-CM

## 2023-10-31 DIAGNOSIS — Z952 Presence of prosthetic heart valve: Secondary | ICD-10-CM

## 2023-10-31 LAB — POCT INR: INR: 3.1 — AB (ref 2.0–3.0)

## 2023-10-31 NOTE — Patient Instructions (Signed)
 Description   INR 3.1; Spoke with pt and instructed pt to continue taking warfarin 1.5 tablets of warfarin daily. Stay consistent with greens (daily).  Recheck INR in 2 weeks. Coumadin  Clinic [phone]

## 2023-11-14 ENCOUNTER — Ambulatory Visit (INDEPENDENT_AMBULATORY_CARE_PROVIDER_SITE_OTHER): Admitting: *Deleted

## 2023-11-14 DIAGNOSIS — Z952 Presence of prosthetic heart valve: Secondary | ICD-10-CM

## 2023-11-14 DIAGNOSIS — Z7901 Long term (current) use of anticoagulants: Secondary | ICD-10-CM | POA: Diagnosis not present

## 2023-11-14 LAB — POCT INR: INR: 2.8 (ref 2.0–3.0)

## 2023-11-14 NOTE — Progress Notes (Signed)
 Description   INR 2.8; Spoke with pt and instructed pt to continue taking warfarin 1.5 tablets of warfarin daily. Stay consistent with greens (daily).  Recheck INR in 2 weeks. Coumadin  Clinic [phone]

## 2023-11-14 NOTE — Patient Instructions (Signed)
 Description   INR 2.8; Spoke with pt and instructed pt to continue taking warfarin 1.5 tablets of warfarin daily. Stay consistent with greens (daily).  Recheck INR in 2 weeks. Coumadin  Clinic [phone]

## 2023-11-28 ENCOUNTER — Ambulatory Visit (INDEPENDENT_AMBULATORY_CARE_PROVIDER_SITE_OTHER): Admitting: *Deleted

## 2023-11-28 DIAGNOSIS — Z952 Presence of prosthetic heart valve: Secondary | ICD-10-CM

## 2023-11-28 DIAGNOSIS — Z7901 Long term (current) use of anticoagulants: Secondary | ICD-10-CM

## 2023-11-28 LAB — POCT INR: INR: 2.7 (ref 2.0–3.0)

## 2023-11-28 NOTE — Progress Notes (Signed)
 Description   INR 2.7; Spoke with pt and instructed pt to continue taking warfarin 1.5 tablets of warfarin daily. Stay consistent with greens (daily).  Recheck INR in 2 weeks. Coumadin  Clinic [phone]

## 2023-11-28 NOTE — Patient Instructions (Signed)
 Description   INR 2.7; Spoke with pt and instructed pt to continue taking warfarin 1.5 tablets of warfarin daily. Stay consistent with greens (daily).  Recheck INR in 2 weeks. Coumadin  Clinic [phone]

## 2023-12-12 ENCOUNTER — Ambulatory Visit (INDEPENDENT_AMBULATORY_CARE_PROVIDER_SITE_OTHER): Admitting: Internal Medicine

## 2023-12-12 DIAGNOSIS — Z952 Presence of prosthetic heart valve: Secondary | ICD-10-CM | POA: Diagnosis not present

## 2023-12-12 DIAGNOSIS — Z7901 Long term (current) use of anticoagulants: Secondary | ICD-10-CM | POA: Diagnosis not present

## 2023-12-12 LAB — POCT INR: INR: 3.2 — AB (ref 2.0–3.0)

## 2023-12-25 DIAGNOSIS — L821 Other seborrheic keratosis: Secondary | ICD-10-CM | POA: Diagnosis not present

## 2023-12-25 DIAGNOSIS — D225 Melanocytic nevi of trunk: Secondary | ICD-10-CM | POA: Diagnosis not present

## 2023-12-25 DIAGNOSIS — L578 Other skin changes due to chronic exposure to nonionizing radiation: Secondary | ICD-10-CM | POA: Diagnosis not present

## 2023-12-25 DIAGNOSIS — L300 Nummular dermatitis: Secondary | ICD-10-CM | POA: Diagnosis not present

## 2023-12-25 DIAGNOSIS — L570 Actinic keratosis: Secondary | ICD-10-CM | POA: Diagnosis not present

## 2023-12-26 ENCOUNTER — Ambulatory Visit (INDEPENDENT_AMBULATORY_CARE_PROVIDER_SITE_OTHER)

## 2023-12-26 DIAGNOSIS — Z7901 Long term (current) use of anticoagulants: Secondary | ICD-10-CM

## 2023-12-26 DIAGNOSIS — Z952 Presence of prosthetic heart valve: Secondary | ICD-10-CM

## 2023-12-26 LAB — POCT INR: INR: 2.9 (ref 2.0–3.0)

## 2023-12-28 DIAGNOSIS — H35372 Puckering of macula, left eye: Secondary | ICD-10-CM | POA: Diagnosis not present

## 2023-12-28 DIAGNOSIS — H25813 Combined forms of age-related cataract, bilateral: Secondary | ICD-10-CM | POA: Diagnosis not present

## 2024-01-09 ENCOUNTER — Ambulatory Visit (INDEPENDENT_AMBULATORY_CARE_PROVIDER_SITE_OTHER)

## 2024-01-09 DIAGNOSIS — Z7901 Long term (current) use of anticoagulants: Secondary | ICD-10-CM | POA: Diagnosis not present

## 2024-01-09 DIAGNOSIS — Z952 Presence of prosthetic heart valve: Secondary | ICD-10-CM

## 2024-01-09 LAB — POCT INR: INR: 3.1 — AB (ref 2.0–3.0)

## 2024-01-15 DIAGNOSIS — Z7901 Long term (current) use of anticoagulants: Secondary | ICD-10-CM | POA: Diagnosis not present

## 2024-01-15 DIAGNOSIS — F419 Anxiety disorder, unspecified: Secondary | ICD-10-CM | POA: Diagnosis not present

## 2024-01-15 DIAGNOSIS — H35372 Puckering of macula, left eye: Secondary | ICD-10-CM | POA: Diagnosis not present

## 2024-01-15 DIAGNOSIS — H259 Unspecified age-related cataract: Secondary | ICD-10-CM | POA: Diagnosis not present

## 2024-01-15 DIAGNOSIS — H25812 Combined forms of age-related cataract, left eye: Secondary | ICD-10-CM | POA: Diagnosis not present

## 2024-01-15 DIAGNOSIS — E1136 Type 2 diabetes mellitus with diabetic cataract: Secondary | ICD-10-CM | POA: Diagnosis not present

## 2024-01-15 DIAGNOSIS — E119 Type 2 diabetes mellitus without complications: Secondary | ICD-10-CM | POA: Diagnosis not present

## 2024-01-15 DIAGNOSIS — J449 Chronic obstructive pulmonary disease, unspecified: Secondary | ICD-10-CM | POA: Diagnosis not present

## 2024-01-15 DIAGNOSIS — H25813 Combined forms of age-related cataract, bilateral: Secondary | ICD-10-CM | POA: Diagnosis not present

## 2024-01-23 ENCOUNTER — Telehealth: Payer: Self-pay | Admitting: *Deleted

## 2024-01-23 ENCOUNTER — Ambulatory Visit: Attending: Cardiology | Admitting: *Deleted

## 2024-01-23 DIAGNOSIS — Z7901 Long term (current) use of anticoagulants: Secondary | ICD-10-CM | POA: Diagnosis not present

## 2024-01-23 DIAGNOSIS — Z952 Presence of prosthetic heart valve: Secondary | ICD-10-CM | POA: Diagnosis not present

## 2024-01-23 LAB — POCT INR: INR: 4.3 — AB (ref 2.0–3.0)

## 2024-01-23 NOTE — Progress Notes (Signed)
 Description   In Office INR-4.3; Do not take any warfarin today then continue taking warfarin 1.5 tablets of warfarin daily. Stay consistent with greens (daily).  Recheck INR in 2 weeks at Home. Coumadin  Clinic [phone]

## 2024-01-23 NOTE — Patient Instructions (Addendum)
 Description   In Office INR-4.3; Do not take any warfarin today then continue taking warfarin 1.5 tablets of warfarin daily. Stay consistent with greens (daily).  Recheck INR in 2 weeks at Home. Coumadin  Clinic [phone]

## 2024-01-23 NOTE — Telephone Encounter (Signed)
 Called patient since INR is due. She states she has insurance issues and reinstating her insurance.   She states she tried to person INR testing today but strips expired, set appt for today in [place] office.

## 2024-01-25 ENCOUNTER — Ambulatory Visit

## 2024-01-28 ENCOUNTER — Telehealth: Payer: Self-pay | Admitting: Cardiology

## 2024-01-28 NOTE — Telephone Encounter (Signed)
 Pt has been scheduled at the [facility] location to see Dr Jeffrie 03/20/24.

## 2024-01-28 NOTE — Telephone Encounter (Signed)
 Pt called requesting an sooner appt in January after Echo is completed. Pt stated she wanted to see the Dr and would like a c/b from Dr Jeffrie nurse please advise

## 2024-01-28 NOTE — Telephone Encounter (Signed)
 Patient states Dr. Jeffrie discussed with her at her last visit trying to get her appointments lined up in the same month rather than having them spread across several months.  Patient has CT and echo scheduled for January, with the echo being last on 1/16. Patient is requesting to schedule an appt with Dr. Jeffrie in January. She states she has travel plans she is trying to arrange and would like to have this all completed in January as discussed at last office visit.  Informed patient I did not see any openings (holds on provider's schedule). Will forward to Dr. Jeffrie' nurse to follow-up with her about getting an appt scheduled.

## 2024-02-06 ENCOUNTER — Ambulatory Visit (INDEPENDENT_AMBULATORY_CARE_PROVIDER_SITE_OTHER): Admitting: *Deleted

## 2024-02-06 DIAGNOSIS — Z7901 Long term (current) use of anticoagulants: Secondary | ICD-10-CM

## 2024-02-06 DIAGNOSIS — Z952 Presence of prosthetic heart valve: Secondary | ICD-10-CM | POA: Diagnosis not present

## 2024-02-06 LAB — POCT INR: INR: 3.4 — AB (ref 2.0–3.0)

## 2024-02-06 NOTE — Patient Instructions (Addendum)
 Description   INR-3.4; Spoke with patient and advised to continue taking warfarin 1.5 tablets of warfarin daily. Have some leafy veggies today and stay consistent with greens (daily).  Recheck INR in 2 weeks at Home. Coumadin  Clinic [phone]

## 2024-02-06 NOTE — Progress Notes (Signed)
 Description   INR-3.4; Spoke with patient and advised to continue taking warfarin 1.5 tablets of warfarin daily. Have some leafy veggies today and stay consistent with greens (daily).  Recheck INR in 2 weeks at Home. [facility]  Clinic [phone]

## 2024-02-20 ENCOUNTER — Ambulatory Visit (INDEPENDENT_AMBULATORY_CARE_PROVIDER_SITE_OTHER): Admitting: *Deleted

## 2024-02-20 DIAGNOSIS — Z952 Presence of prosthetic heart valve: Secondary | ICD-10-CM | POA: Diagnosis not present

## 2024-02-20 DIAGNOSIS — Z7901 Long term (current) use of anticoagulants: Secondary | ICD-10-CM

## 2024-02-20 LAB — POCT INR: INR: 3.4 — AB (ref 2.0–3.0)

## 2024-02-20 NOTE — Progress Notes (Signed)
 Description   INR-3.4; Spoke with patient and advised to continue taking warfarin 1.5 tablets of warfarin daily. Have some leafy veggies today and stay consistent with greens (daily).  Recheck INR in 2 weeks at Home. [facility]  Clinic [phone]

## 2024-02-20 NOTE — Patient Instructions (Signed)
 Description   INR-3.4; Spoke with patient and advised to continue taking warfarin 1.5 tablets of warfarin daily. Have some leafy veggies today and stay consistent with greens (daily).  Recheck INR in 2 weeks at Home. [facility]in  Clinic [phone]

## 2024-02-27 ENCOUNTER — Ambulatory Visit (HOSPITAL_COMMUNITY)
Admission: RE | Admit: 2024-02-27 | Discharge: 2024-02-27 | Disposition: A | Discharge status: Home / Self Care | Source: Ambulatory Visit | Attending: Internal Medicine | Admitting: Internal Medicine

## 2024-02-27 DIAGNOSIS — E119 Type 2 diabetes mellitus without complications: Secondary | ICD-10-CM | POA: Insufficient documentation

## 2024-02-27 DIAGNOSIS — Z952 Presence of prosthetic heart valve: Secondary | ICD-10-CM | POA: Insufficient documentation

## 2024-02-27 DIAGNOSIS — I77810 Thoracic aortic ectasia: Secondary | ICD-10-CM | POA: Diagnosis present

## 2024-02-27 LAB — POCT I-STAT CREATININE: Creatinine, Ser: 0.8 mg/dL (ref 0.44–1.00)

## 2024-02-27 MED ORDER — IOHEXOL 350 MG/ML SOLN
75.0000 mL | Freq: Once | INTRAVENOUS | Status: AC | PRN
  Administered 2024-02-27: 75 mL via INTRAVENOUS

## 2024-02-28 ENCOUNTER — Ambulatory Visit: Payer: Self-pay | Admitting: Cardiology

## 2024-03-04 ENCOUNTER — Telehealth: Payer: Self-pay | Admitting: *Deleted

## 2024-03-04 NOTE — Telephone Encounter (Signed)
 Received a voicemail that the patient called [facility] and that everything is all set and she will test tomorrow as scheduled. She states they are sending her more supplies out but she use what she has until then.

## 2024-03-04 NOTE — Telephone Encounter (Signed)
 Called patient to follow up with her regarding continued INR testing. Advised that she needs to call [phone] to complete her re-enrollment steps and she verbalized understanding. Advised her that last week, I did fax over her insurance info that was scanned in and her demographics. She states she will call them soon and follow up.

## 2024-03-05 ENCOUNTER — Ambulatory Visit

## 2024-03-05 ENCOUNTER — Ambulatory Visit (INDEPENDENT_AMBULATORY_CARE_PROVIDER_SITE_OTHER)

## 2024-03-05 VITALS — BP 126/82 | HR 77 | Resp 18 | Ht 63.0 in | Wt 228.0 lb

## 2024-03-05 DIAGNOSIS — I7121 Aneurysm of the ascending aorta, without rupture: Secondary | ICD-10-CM | POA: Diagnosis not present

## 2024-03-05 DIAGNOSIS — Z7901 Long term (current) use of anticoagulants: Secondary | ICD-10-CM | POA: Diagnosis not present

## 2024-03-05 DIAGNOSIS — Z952 Presence of prosthetic heart valve: Secondary | ICD-10-CM

## 2024-03-05 LAB — POCT INR: INR: 3 (ref 2.0–3.0)

## 2024-03-05 NOTE — Progress Notes (Signed)
 "      [address] [place] [address]             [phone]            Denise Curtis [id number] 23-Nov-1955   History of Present Illness:  Denise Curtis is a 69 year old female with medical history of hypertension, hyperlipidemia, type II diabetes, morbid obesity, and degenerative joint disease of the knees who underwent aortic valve replacement in 2016 in [place] at [facility] for bicuspid aortic valve stenosis.  She was last seen in clinic by Dr. Lucas on 08/30/2022.  She is being followed for her ascending thoracic aortic aneurysm and for flow changes with her aortic valve replacement.  Echocardiogram from 2025 showed that her EF was normal and the aortic valve mean gradient measured 23.0 mmHg.   She presents to the clinic today and reports that she is doing well.  She is active and remains symptom free at this time. She denies chest pain, shortness of breath, lower leg edema and dizziness. She is followed by coumadin  clinic and her INR has been stable.     Medications Ordered Prior to Encounter[1]   ROS: Review of Systems  Constitutional: Negative.  Negative for fever and malaise/fatigue.  Respiratory: Negative.  Negative for cough and shortness of breath.   Cardiovascular: Negative.  Negative for chest pain, palpitations and leg swelling.  Neurological: Negative.  Negative for dizziness and headaches.     BP 126/82 (BP Location: Right Arm)   Pulse 77   Resp 18   Ht 5' 3 (1.6 m)   Wt 228 lb (103.4 kg)   SpO2 98%   BMI 40.39 kg/m   Physical Exam Constitutional:      Appearance: Normal appearance.  HENT:     Head: Normocephalic and atraumatic.  Cardiovascular:     Rate and Rhythm: Normal rate and regular rhythm.     Heart sounds: Normal heart sounds, S1 normal and S2 normal.     Comments: Mechanical click Pulmonary:     Effort: Pulmonary effort is normal.     Breath sounds: Normal breath sounds.  Musculoskeletal:      Cervical back: Normal range of motion.     Right lower leg: No edema.     Left lower leg: No edema.  Skin:    General: Skin is warm and dry.  Neurological:     General: No focal deficit present.     Mental Status: She is alert.      Imaging: CLINICAL DATA:  Thoracic aortic dilation   EXAM: CT ANGIOGRAPHY CHEST WITH CONTRAST   TECHNIQUE: Multidetector CT imaging of the chest was performed using the standard protocol during bolus administration of intravenous contrast. Multiplanar CT image reconstructions and MIPs were obtained to evaluate the vascular anatomy.   RADIATION DOSE REDUCTION: This exam was performed according to the departmental dose-optimization program which includes automated exposure control, adjustment of the mA and/or kV according to patient size and/or use of iterative reconstruction technique.   CONTRAST:  75mL OMNIPAQUE  IOHEXOL  350 MG/ML SOLN   COMPARISON:  None Available.   FINDINGS: CARDIOVASCULAR:   Limitations by motion: Mild   Preferential opacification of the thoracic aorta. No evidence of thoracic aortic dissection.   Aortic Root:   --Valve: 2.4 cm   --Sinuses: 3.1 cm   --Sinotubular Junction: 3.5 cm   Thoracic Aorta:   --Ascending Aorta: 4.5 cm   --Aortic  Arch: 2.8 cm   --Descending Aorta: 2.3 cm   Other:   Normal heart size.  No pericardial effusion.   Postsurgical changes of aortic valvuloplasty and multivessel coronary bypass grafting.   Conventional 3-sided LEFT aortic arch. No atherosclerosis or hemodynamic stenosis at the origins of the great vessels.   Mediastinum/Nodes: No enlarged mediastinal, hilar, or axillary lymph nodes. 1.1 cm LEFT and sub-1 cm RIGHT thyroid  nodules, no follow-up is recommended. Trachea, and esophagus demonstrate no significant findings.   Lungs/Pleura: 6 mm LEFT perifissural nodularity, likely an intrapulmonary lymph node. Lungs are otherwise clear without focal consolidation, mass or  suspicious pulmonary nodule. No pleural effusion or pneumothorax.   Upper Abdomen: No acute abnormality. Small hiatus hernia. Punctate calcifications within spleen, likely sequelae of prior granulomatous disease.   Musculoskeletal: No acute chest wall abnormality. Median sternotomy change. No acute osseous findings.   Review of the MIP images confirms the above findings.   IMPRESSION: 1. 4.5 cm fusiform aneurysmal dilatation of the ascending thoracic aorta. Recommend semi-annual imaging followup by CTA or MRA and referral to cardiothoracic surgery if not already obtained. This recommendation follows 2010 ACCF/AHA/AATS/ACR/ASA/SCA/SCAI/SIR/STS/SVM Guidelines for the Diagnosis and Management of Patients With Thoracic Aortic Disease. Circulation. 2010; 121: Z733-z630. Aortic aneurysm NOS (ICD10-I71.9) 2. Changes of aortic valvuloplasty and multivessel coronary bypass grafting. Additional incidental, chronic and senescent findings as above.     Electronically Signed   By: Thom Hall M.D.   On: 02/27/2024 16:45     A/P: Aneurysm of ascending aorta without rupture -4.5 cm ascending thoracic aortic aneurysm on CTA of chest. -We discussed the natural history and and risk factors for growth of ascending aortic aneurysms. Discussed recommendations to minimize the risk of further expansion or dissection including careful blood pressure control, avoidance of contact sports and heavy lifting, attention to lipid management.  We covered the importance of staying never user of tobacco.  The patient does not yet meet surgical criteria of >5.5cm. The patient is aware of signs and symptoms of aortic dissection and when to present to the emergency department  -She has an echocardiogram scheduled for 03/07/2024 and follow up with cardiology after.  -Follow up in one year with CTA of chest for continued surveillance of ascending aortic aneurysm. She is to continue follow up with cardiology      Risk Modification:  Statin:  atorvastatin   Smoking cessation instruction/counseling given:  never user  Patient was counseled on importance of Blood Pressure Control  They are instructed to contact their Primary Care Physician if they start to have blood pressure readings over 130s/90s. Do not ever stop blood pressure medications on your own, unless instructed by healthcare professional.  Please avoid use of Fluoroquinolones as this can potentially increase your risk of Aortic Rupture and/or Dissection  Patient educated on signs and symptoms of Aortic Dissection, handout also provided in AVS  Denise CHRISTELLA Rough, PA-C 03/05/2024     [1]  Current Outpatient Medications on File Prior to Visit  Medication Sig Dispense Refill   atorvastatin  (LIPITOR) 40 MG tablet TAKE 1 TABLET BY MOUTH EVERY DAY 90 tablet 3   clindamycin (CLEOCIN) 300 MG capsule Take 300 mg by mouth 2 (two) times daily. Prior to dental procedure     enoxaparin  (LOVENOX ) 100 MG/ML injection Inject 1 mL (100 mg total) into the skin every 12 (twelve) hours. 20 mL 1   lisinopril  (ZESTRIL ) 10 MG tablet Take 10 mg by mouth daily.     metoprolol  succinate (TOPROL -XL) 25  MG 24 hr tablet Take 1 tablet (25 mg total) by mouth daily. 90 tablet 0   MOUNJARO 15 MG/0.5ML Pen SMARTSIG:15 Milligram(s) SUB-Q Once a Week     triamcinolone cream (KENALOG) 0.5 % Apply topically 2 (two) times daily as needed.     warfarin (COUMADIN ) 5 MG tablet TAKE AS DIRECTED. 60 DAY SUPPLY 150 tablet 1   No current facility-administered medications on file prior to visit.   "

## 2024-03-06 NOTE — Patient Instructions (Signed)

## 2024-03-07 ENCOUNTER — Ambulatory Visit (HOSPITAL_COMMUNITY)
Admission: RE | Admit: 2024-03-07 | Discharge: 2024-03-07 | Disposition: A | Discharge status: Home / Self Care | Source: Ambulatory Visit | Attending: Cardiology | Admitting: Cardiology

## 2024-03-07 DIAGNOSIS — Z952 Presence of prosthetic heart valve: Secondary | ICD-10-CM | POA: Diagnosis present

## 2024-03-07 DIAGNOSIS — I77810 Thoracic aortic ectasia: Secondary | ICD-10-CM | POA: Insufficient documentation

## 2024-03-07 LAB — ECHOCARDIOGRAM COMPLETE
AR max vel: 1.14 cm2
AV Area VTI: 1.21 cm2
AV Area mean vel: 1.17 cm2
AV Mean grad: 21 mmHg
AV Peak grad: 39.2 mmHg
Ao pk vel: 3.13 m/s
Area-P 1/2: 4.21 cm2
S' Lateral: 3 cm

## 2024-03-19 ENCOUNTER — Ambulatory Visit (INDEPENDENT_AMBULATORY_CARE_PROVIDER_SITE_OTHER)

## 2024-03-19 DIAGNOSIS — Z7901 Long term (current) use of anticoagulants: Secondary | ICD-10-CM | POA: Diagnosis not present

## 2024-03-19 DIAGNOSIS — Z952 Presence of prosthetic heart valve: Secondary | ICD-10-CM | POA: Diagnosis not present

## 2024-03-19 LAB — POCT INR: INR: 2.7 (ref 2.0–3.0)

## 2024-03-20 ENCOUNTER — Encounter (HOSPITAL_BASED_OUTPATIENT_CLINIC_OR_DEPARTMENT_OTHER): Payer: Self-pay | Admitting: Cardiology

## 2024-03-20 ENCOUNTER — Ambulatory Visit (HOSPITAL_BASED_OUTPATIENT_CLINIC_OR_DEPARTMENT_OTHER): Admitting: Cardiology

## 2024-03-20 VITALS — BP 132/80 | HR 82 | Ht 63.0 in | Wt 235.0 lb

## 2024-03-20 DIAGNOSIS — Z7901 Long term (current) use of anticoagulants: Secondary | ICD-10-CM | POA: Diagnosis not present

## 2024-03-20 DIAGNOSIS — E119 Type 2 diabetes mellitus without complications: Secondary | ICD-10-CM

## 2024-03-20 DIAGNOSIS — I77810 Thoracic aortic ectasia: Secondary | ICD-10-CM

## 2024-03-20 DIAGNOSIS — Z952 Presence of prosthetic heart valve: Secondary | ICD-10-CM | POA: Diagnosis not present

## 2024-03-20 NOTE — Patient Instructions (Signed)
 Medication Instructions:  No changes *If you need a refill on your cardiac medications before your next appointment, please call your pharmacy*  Lab Work: none  Testing/Procedures: ECHO DUE JAN 2027 BEFORE NEXT VISIT WITH DR. JEFFRIE Your physician has requested that you have an echocardiogram. Echocardiography is a painless test that uses sound waves to create images of your heart. It provides your doctor with information about the size and shape of your heart and how well your hearts chambers and valves are working. This procedure takes approximately one hour. There are no restrictions for this procedure. Please do NOT wear cologne, perfume, aftershave, or lotions (deodorant is allowed). Please arrive 15 minutes prior to your appointment time.  Please note: We ask at that you not bring children with you during ultrasound (echo/ vascular) testing. Due to room size and safety concerns, children are not allowed in the ultrasound rooms during exams. Our front office staff cannot provide observation of children in our lobby area while testing is being conducted. An adult accompanying a patient to their appointment will only be allowed in the ultrasound room at the discretion of the ultrasound technician under special circumstances. We apologize for any inconvenience.   Follow-Up: At [facility], you and your health needs are our priority.  As part of our continuing mission to provide you with exceptional heart care, our providers are all part of one team.  This team includes your primary Cardiologist (physician) and Advanced Practice Providers or APPs (Physician Assistants and Nurse Practitioners) who all work together to provide you with the care you need, when you need it.  Your next appointment:   12 month(s)  Provider:   Oneil Jeffrie, MD

## 2024-03-20 NOTE — Progress Notes (Signed)
 " Cardiology Office Note:  .   Date:  03/20/2024  ID:  Denise Curtis, DOB April 22, 1955, MRN [id number] PCP: Denise Leita DEL, MD  [facility] Providers Cardiologist:  Oneil Parchment, MD     History of Present Illness: .   Denise Curtis is a 69 y.o. 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 She was previously referred by Dr. Lucas for consultation regarding valve replacement.  Prosthetic aortic valve function - [employer] # 21 prosthetic aortic valve placed in 2016 for aortic stenosis - Valve remains stable with Vmax of 3.1 meters per second and a 21 mmHg gradient - Ongoing close monitoring  Thoracic aortic aneurysm - Aneurysm last measured at 4.6 cm on echocardiogram and CT scan in 2024 - No interval change, demonstrating stability over time  Hypertension and anticoagulation management - Lisinopril  10 mg daily for blood pressure control - Home blood pressure averages 130/80 mmHg, with higher readings during office visits - On Coumadin  with INR of 2.7  Hyperlipidemia management - Atorvastatin  40 mg daily  Knee pain and impaired mobility - Worsening knee pain impacting mobility - Difficulty walking around the house and performing daily activities      ROS: No CP, no SOB, >4 METS  Studies Reviewed: .        Results Labs INR (03/19/2024): 2.7 Hemoglobin A1c (03/19/2024): 5.9 LDL (03/19/2024): 71 Creatinine (03/19/2024): 0.8  Radiology CT chest (2024): Ascending thoracic aorta diameter 4.6 cm, stable compared to prior  Diagnostic Transthoracic echocardiogram (02/2024): Ascending thoracic aorta diameter 4.6 cm, prosthetic aortic valve stable, Vmax 3.1 m/s, mean gradient 21 mmHg  Risk Assessment/Calculations:            Physical Exam:   VS:  BP 132/80 (BP Location: Left Arm, Patient Position:  Sitting, Cuff Size: Large)   Pulse 82   Ht 5' 3 (1.6 m)   Wt 235 lb (106.6 kg)   SpO2 99%   BMI 41.63 kg/m    Wt Readings from Last 3 Encounters:  03/20/24 235 lb (106.6 kg)  03/05/24 228 lb (103.4 kg)  09/03/23 236 lb (107 kg)    GEN: Well nourished, well developed in no acute distress NECK: No JVD; No carotid bruits CARDIAC: RRR, Sharp S2 click, no murmurs, no rubs, no gallops RESPIRATORY:  Clear to auscultation without rales, wheezing or rhonchi  ABDOMEN: Soft, non-tender, non-distended EXTREMITIES:  No edema; No deformity   ASSESSMENT AND PLAN: .    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 Valve remains stable with a Vmax of 3.1 m/s and a 21 mmHg gradient. High-risk surgery for valve replacement discussed with Dr. Lucas, but decision made to continue monitoring closely. - Continue monitoring valve function closely.  Ascending thoracic aortic aneurysm Thoracic aortic aneurysm measuring 4.6 cm, stable on last echocardiogram and CT in 2024. - Continue monitoring aneurysm size.  Hypertension Blood pressure at 132/80 mmHg, slightly above target. Discussed potential increase in lisinopril  or metoprolol  to optimize blood pressure control. Consideration of moderate cardiovascular risk for potential knee replacement surgery. - Discuss with Dr. Agustina about optimizing blood pressure management, including potential increase in lisinopril  or metoprolol .  Hyperlipidemia LDL cholesterol at 71 mg/dL, indicating good control with current atorvastatin  therapy. - Continue atorvastatin  40  mg daily.  If she absolutely needs to have knee surgery done, she may proceed with moderate overall cardiac risk.         Dispo: 1 yr  Signed, Oneil Parchment, MD  "

## 2025-02-23 ENCOUNTER — Ambulatory Visit (HOSPITAL_COMMUNITY)
